# Patient Record
Sex: Male | Born: 1995 | Race: Black or African American | Hispanic: No | Marital: Single | Smoking: Current every day smoker
Health system: Southern US, Community
[De-identification: ages and names within clinical notes are randomized; demographics above are authoritative.]

## PROBLEM LIST (undated history)

## (undated) DIAGNOSIS — Z973 Presence of spectacles and contact lenses: Secondary | ICD-10-CM

## (undated) DIAGNOSIS — Z8709 Personal history of other diseases of the respiratory system: Secondary | ICD-10-CM

## (undated) DIAGNOSIS — L723 Sebaceous cyst: Secondary | ICD-10-CM

## (undated) HISTORY — PX: NO PAST SURGERIES: SHX2092

---
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
## 2013-08-06 ENCOUNTER — Encounter: Payer: Self-pay | Admitting: Internal Medicine

## 2013-08-06 ENCOUNTER — Ambulatory Visit (INDEPENDENT_AMBULATORY_CARE_PROVIDER_SITE_OTHER): Payer: BC Managed Care – PPO | Admitting: Internal Medicine

## 2013-08-06 VITALS — BP 110/64 | HR 62 | Temp 97.5°F | Ht 78.0 in | Wt 187.0 lb

## 2013-08-06 DIAGNOSIS — J45909 Unspecified asthma, uncomplicated: Secondary | ICD-10-CM | POA: Insufficient documentation

## 2013-08-06 MED ORDER — MOMETASONE FURO-FORMOTEROL FUM 100-5 MCG/ACT IN AERO: INHALATION_SPRAY | RESPIRATORY_TRACT | Status: DC

## 2013-08-06 NOTE — Progress Notes (Signed)
   Subjective:    Patient ID: Ray Page, male    DOB: Sep 06, 1995   MRN: [id number]  HPI  59 yobm started smoking 2011 with bad asthma as child on daily meds until 10th grade didn't need any meds continue to improve but in 12 th grade started more intense aerobics at that point  and noted first hour of ex was consistently tough due to doe then takes a break and does fine thereafter no real change with advair but albuterol.   08/06/2013 1st [facility] office visit/ Chae Oommen cc poor ex tol x one year, but only for the first game of b ball and does fine, ? If saba helps prior to first game ( never really answered that question.    No obvious day to day or daytime variabilty or assoc chronic cough or cp or chest tightness, subjective wheeze overt sinus or hb symptoms. No unusual exp hx or h/o childhood pna/ asthma or knowledge of premature birth.  Sleeping ok without nocturnal  or early am exacerbation  of respiratory  c/o's or need for noct saba. Also denies any obvious fluctuation of symptoms with weather or environmental changes or other aggravating or alleviating factors except as outlined above   Current Medications, Allergies, Complete Past Medical History, Past Surgical History, Family History, and Social History were reviewed in [employer] record.          Review of Systems  Constitutional: Negative for fever, chills, activity change, appetite change and unexpected weight change.  HENT: Negative for congestion, dental problem, postnasal drip, rhinorrhea, sneezing, sore throat, trouble swallowing and voice change.   Eyes: Negative for visual disturbance.  Respiratory: Positive for shortness of breath. Negative for cough and choking.   Cardiovascular: Negative for chest pain and leg swelling.  Gastrointestinal: Negative for nausea, vomiting and abdominal pain.  Genitourinary: Negative for difficulty urinating.  Musculoskeletal: Negative for arthralgias.   Skin: Negative for rash.  Psychiatric/Behavioral: Negative for behavioral problems and confusion.       Objective:   Physical Exam  amb bm nad  Wt Readings from Last 3 Encounters:  08/06/13 187 lb (84.823 kg) (86%*, Z = 1.09)   * Growth percentiles are based on CDC 2-20 Years data.     HEENT: nl dentition, turbinates, and orophanx. Nl external ear canals without cough reflex   NECK :  without JVD/Nodes/TM/ nl carotid upstrokes bilaterally   LUNGS: no acc muscle use, clear to A and P bilaterally without cough on insp or exp maneuvers   CV:  RRR  no s3 or murmur or increase in P2, no edema   ABD:  soft and nontender with nl excursion in the supine position. No bruits or organomegaly, bowel sounds nl  MS:  warm without deformities, calf tenderness, cyanosis or clubbing  SKIN: warm and dry without lesions    NEURO:  alert, approp, no deficits    CXR  08/06/2013 : Did not go as requested      Assessment & Plan:

## 2013-08-06 NOTE — Patient Instructions (Addendum)
Dulera 100 is 2 puffs taken each am   Only use your albuterol (proair) as a rescue medication to be used if you can't catch your breath by resting or doing a relaxed purse lip breathing pattern.  - The less you use it, the better it will work when you need it. - Ok to use up to every 4 hours if you must but call for immediate appointment if use goes up over your usual need - Don't leave home without it !!  (think of it like your spare tire for your car)   Please remember to go to the  x-ray department downstairs for your tests - we will call you with the results when they are available.    Please schedule a follow up office visit in 4 weeks, sooner if needed with pft's  Late add note did not go for cxr as requested

## 2013-08-07 NOTE — Assessment & Plan Note (Addendum)
Hx is very suggestive of pure EIA (the resolution after the first flare of the day is textbook pattern)   so rec trial of dulera 100 2pffs at least an hour before ex - if not improving needs cpst with spirometry before and after.  Other possibility includes anxiety related hyperventilation/ panic discorder but note never happens at rest or hs    Each maintenance medication was reviewed in detail including most importantly the difference between maintenance and as needed and under what circumstances the prns are to be used.  Please see instructions for details which were reviewed in writing and the patient given a copy.    The proper method of use, as well as anticipated side effects, of a metered-dose inhaler are discussed and demonstrated to the patient. Improved effectiveness after extensive coaching during this visit to a level of approximately  75%

## 2013-09-09 ENCOUNTER — Ambulatory Visit: Payer: BC Managed Care – PPO | Admitting: Internal Medicine

## 2013-09-17 ENCOUNTER — Ambulatory Visit (INDEPENDENT_AMBULATORY_CARE_PROVIDER_SITE_OTHER): Payer: BC Managed Care – PPO | Admitting: Family Medicine

## 2013-09-17 ENCOUNTER — Ambulatory Visit: Payer: BC Managed Care – PPO

## 2013-09-17 VITALS — BP 118/70 | HR 80 | Temp 98.2°F | Resp 16 | Ht 75.5 in | Wt 186.2 lb

## 2013-09-17 DIAGNOSIS — R0781 Pleurodynia: Secondary | ICD-10-CM

## 2013-09-17 DIAGNOSIS — R071 Chest pain on breathing: Secondary | ICD-10-CM

## 2013-09-17 DIAGNOSIS — T148XXA Other injury of unspecified body region, initial encounter: Secondary | ICD-10-CM

## 2013-09-17 DIAGNOSIS — R079 Chest pain, unspecified: Secondary | ICD-10-CM

## 2013-09-17 NOTE — Patient Instructions (Signed)
Back Exercises  Back exercises help treat and prevent back injuries. The goal is to increase your strength in your belly (abdominal) and back muscles. These exercises can also help with flexibility. Start these exercises when told by your doctor.  HOME CARE  Back exercises include:  Pelvic Tilt.  · Lie on your back with your knees bent. Tilt your pelvis until the lower part of your back is against the floor. Hold this position 5 to 10 sec. Repeat this exercise 5 to 10 times.  Knee to Chest.  · Pull 1 knee up against your chest and hold for 20 to 30 seconds. Repeat this with the other knee. This may be done with the other leg straight or bent, whichever feels better. Then, pull both knees up against your chest.  Sit-Ups or Curl-Ups.  · Bend your knees 90 degrees. Start with tilting your pelvis, and do a partial, slow sit-up. Only lift your upper half 30 to 45 degrees off the floor. Take at least 2 to 3 seonds for each sit-up. Do not do sit-ups with your knees out straight. If partial sit-ups are difficult, simply do the above but with only tightening your belly (abdominal) muscles and holding it as told.  Hip-Lift.  · Lie on your back with your knees flexed 90 degrees. Push down with your feet and shoulders as you raise your hips 2 inches off the floor. Hold for 10 seconds, repeat 5 to 10 times.  Back Arches.  · Lie on your stomach. Prop yourself up on bent elbows. Slowly press on your hands, causing an arch in your low back. Repeat 3 to 5 times.  Shoulder-Lifts.  · Lie face down with arms beside your body. Keep hips and belly pressed to floor as you slowly lift your head and shoulders off the floor.  Do not overdo your exercises. Be careful in the beginning. Exercises may cause you some mild back discomfort. If the pain lasts for more than 15 minutes, stop the exercises until you see your doctor. Improvement with exercise for back problems is slow.   Document Released: 09/16/2010 Document Revised: 11/06/2011  Document Reviewed: 06/15/2011  Exit[employer]® Patient Information ©2014 [employer].

## 2013-09-17 NOTE — Progress Notes (Signed)
Chief Complaint:  Chief Complaint  Patient presents with  . Chest Pain    right side in ribs. Has some pain with breathing in     HPI: Ray Page is a 18 y.o. male who is here for  Right sided rib pain, chest pain with deep breathing; all of this is made worse by taking deep breaths and some movement. He feels it worse when he stretches and takes a deep breath.  He had prior rib pain on the same side int he past due to sleeping on a battery. However this particular injury started this weekend after chopping wood.  He was cutting wood and chopping wood for his fireplace this weekend, all weekend long. Cut down 3-4 large trees.  No SOB, no wheezing. He has asthma especially when he plays basketball, he does not take anything at all for it. He was seen by Dr Sherene SiresWert and was [facility] and also has an albuterol inhaler, he states none of te inhalers or pills he has ever been on actually helps him. He tried the [facility] twice and it did not help so he stopped, he tried albuterol prior to exercise and that seem to not help so he stopped. He really only gets asthma sxs when he is exerting hiimself palyng basketball. He has never been hospitalized for asthma.    Past Medical History  Diagnosis Date  . Asthma    History reviewed. No pertinent past surgical history. History   Social History  . Marital Status: Single    Spouse Name: N/A    Number of Children: N/A  . Years of Education: N/A   Social History Main Topics  . Smoking status: Never Smoker   . Smokeless tobacco: Never Used     Comment: smokes approx 2 cigars per wk and smokes maryjuana daily   . Alcohol Use: No  . Drug Use: No     Comment: Smokes Maryjuana daily  . Sexual Activity: None   Other Topics Concern  . None   Social History Narrative  . None   Family History  Problem Relation Age of Onset  . Asthma Mother    No Known Allergies Prior to Admission medications   Medication Sig Start Date End  Date Taking? Authorizing Provider  mometasone-formoterol (DULERA) 100-5 MCG/ACT AERO Take 2 puffs first thing in am and then another 2 puffs about 12 hours later. 08/06/13   Nyoka CowdenMichael B Wert, MD     ROS: The patient denies fevers, chills, night sweats, unintentional weight loss, chest pain, palpitations, wheezing, dyspnea on exertion, nausea, vomiting, abdominal pain, dysuria, hematuria, melena, numbness, weakness, or tingling.   All other systems have been reviewed and were otherwise negative with the exception of those mentioned in the HPI and as above.    PHYSICAL EXAM: Filed Vitals:   09/17/13 1940  BP: 118/70  Pulse: 80  Temp: 98.2 F (36.8 C)  Resp: 16   Filed Vitals:   09/17/13 1940  Height: 6' 3.5" (1.918 m)  Weight: 186 lb 3.2 oz (84.46 kg)  Spo2           99% Body mass index is 22.96 kg/(m^2).  General: Alert, no acute distress HEENT:  Normocephalic, atraumatic, oropharynx patent. EOMI, PERRLA Cardiovascular:  Regular rate and rhythm, no rubs murmurs or gallops.  No Carotid bruits, radial pulse intact. No pedal edema. + Right sided rib pain with deep breaths and stretching, nontender on palpation Respiratory: Clear  to auscultation bilaterally.  No wheezes, rales, or rhonchi.  No cyanosis, no use of accessory musculature GI: No organomegaly, abdomen is soft and non-tender, positive bowel sounds.  No masses. Skin: No rashes. Neurologic: Facial musculature symmetric. Psychiatric: Patient is appropriate throughout our interaction. Lymphatic: No cervical lymphadenopathy Musculoskeletal: Gait intact.   LABS:    EKG/XRAY:   Primary read interpreted by Dr. Conley Rolls at [facility]. No fx or dislocation No acute cardiopulmonary process   ASSESSMENT/PLAN: Encounter Diagnoses  Name Primary?  . Pleuritic chest pain Yes  . Rib pain on right side   . Sprain and strain    Modify activities until improved, Take otc ibuprofen, bengay prn for rib pain Advise to take inhalers as  prescribed, advise that this is especially important if they only have firewood as a heat source. He states he cracks his windows pen so he does not inhale smoke or fumes.  F/u prn  Gross sideeffects, risk and benefits, and alternatives of medications d/w patient. Patient is aware that all medications have potential sideeffects and we are unable to predict every sideeffect or drug-drug interaction that may occur.  Hamilton Capri PHUONG, DO 09/17/2013 8:32 PM

## 2013-10-13 ENCOUNTER — Ambulatory Visit: Payer: BC Managed Care – PPO | Admitting: Internal Medicine

## 2013-10-15 ENCOUNTER — Encounter: Payer: Self-pay | Admitting: Internal Medicine

## 2014-02-17 ENCOUNTER — Encounter: Payer: Self-pay | Admitting: Adult Health

## 2014-02-17 ENCOUNTER — Ambulatory Visit (INDEPENDENT_AMBULATORY_CARE_PROVIDER_SITE_OTHER): Payer: BC Managed Care – PPO | Admitting: Adult Health

## 2014-02-17 VITALS — BP 124/68 | HR 83 | Temp 98.4°F | Ht 78.0 in | Wt 166.2 lb

## 2014-02-17 DIAGNOSIS — J4541 Moderate persistent asthma with (acute) exacerbation: Secondary | ICD-10-CM

## 2014-02-17 DIAGNOSIS — J45901 Unspecified asthma with (acute) exacerbation: Secondary | ICD-10-CM

## 2014-02-17 MED ORDER — PREDNISONE 10 MG PO TABS: ORAL_TABLET | ORAL | Status: DC

## 2014-02-17 MED ORDER — MOMETASONE FURO-FORMOTEROL FUM 100-5 MCG/ACT IN AERO
INHALATION_SPRAY | RESPIRATORY_TRACT | Status: DC
Start: 2014-02-17 — End: 2016-08-07

## 2014-02-17 MED ORDER — AZITHROMYCIN 250 MG PO TABS: ORAL_TABLET | ORAL | Status: AC

## 2014-02-17 NOTE — Patient Instructions (Addendum)
Zpack take as directed.  Mucinex DM Twice daily  As needed cough/congestion  Prednisone taper over next week.  Fluids and rest  Restart Dulera 2 puffs Twice daily  , rinse after use.  Please contact office for sooner follow up if symptoms do not improve or worsen or seek emergency care

## 2014-02-17 NOTE — Progress Notes (Signed)
   Subjective:    Patient ID: Ray Page, male    DOB: 02-04-1996   MRN: [id number]  HPI  18 yobm started smoking 2011 with bad asthma as child on daily meds until 10th grade didn't need any meds continue to improve but in 12 th grade started more intense aerobics at that point  and noted first hour of ex was consistently tough due to doe then takes a break and does fine thereafter no real change with advair but albuterol.   08/06/2013 1st [facility] office visit/ Wert cc poor ex tol x one year, but only for the first game of b ball and does fine, ? If saba helps prior to first game ( never really answered that question.   >>   02/17/2014 Acute OV  Complains of increased SOB, tightness, prod cough with yellow/beige mucus, wheezing, head congestion/sinus pressure with same colored/clear mucus, PND x3 days.  denies hemoptysis, f/c/s/n/v, leg sweling Not taking Dulera. Stopped when he was feeling good.  No recent travel or abx use.  Still smoking, advised on cessation     Current Medications, Allergies, Complete Past Medical History, Past Surgical History, Family History, and Social History were reviewed in [facility] electronic [facility] record.          Review of Systems  Constitutional: Negative for fever, chills, activity change, appetite change and unexpected weight change.  HENT: Negative for congestion, dental problem, postnasal drip, rhinorrhea, sneezing, sore throat, trouble swallowing and voice change.   Eyes: Negative for visual disturbance.  Respiratory:++cough  Cardiovascular: Negative for chest pain and leg swelling.  Gastrointestinal: Negative for nausea, vomiting and abdominal pain.  Genitourinary: Negative for difficulty urinating.  Musculoskeletal: Negative for arthralgias.  Skin: Negative for rash.  Psychiatric/Behavioral: Negative for behavioral problems and confusion.       Objective:   Physical Exam  amb bm nad     HEENT: nl dentition,  turbinates, and orophanx. Nl external ear canals without cough reflex   NECK :  without JVD/Nodes/TM/ nl carotid upstrokes bilaterally   LUNGS: no acc muscle use, clear to A and P bilaterally without cough on insp or exp maneuvers   CV:  RRR  no s3 or murmur or increase in P2, no edema   ABD:  soft and nontender with nl excursion in the supine position. No bruits or organomegaly, bowel sounds nl  MS:  warm without deformities, calf tenderness, cyanosis or clubbing  SKIN: warm and dry without lesions    NEURO:  alert, approp, no deficits        Assessment & Plan:

## 2014-02-24 NOTE — Assessment & Plan Note (Signed)
Flare with URI   Plan  Ray Page take as directed.  Mucinex DM Twice daily  As needed cough/congestion  Prednisone taper over next week.  Fluids and rest  Restart Dulera 2 puffs Twice daily  , rinse after use.  Please contact office for sooner follow up if symptoms do not improve or worsen or seek emergency care

## 2014-11-05 ENCOUNTER — Ambulatory Visit (INDEPENDENT_AMBULATORY_CARE_PROVIDER_SITE_OTHER): Payer: BC Managed Care – PPO

## 2014-11-05 ENCOUNTER — Ambulatory Visit (INDEPENDENT_AMBULATORY_CARE_PROVIDER_SITE_OTHER): Payer: BC Managed Care – PPO | Admitting: Family Medicine

## 2014-11-05 VITALS — BP 112/62 | HR 65 | Temp 98.2°F | Resp 18 | Ht 77.0 in | Wt 179.0 lb

## 2014-11-05 DIAGNOSIS — M79645 Pain in left finger(s): Secondary | ICD-10-CM

## 2014-11-05 DIAGNOSIS — M25561 Pain in right knee: Secondary | ICD-10-CM

## 2014-11-05 DIAGNOSIS — S86811A Strain of other muscle(s) and tendon(s) at lower leg level, right leg, initial encounter: Secondary | ICD-10-CM

## 2014-11-05 DIAGNOSIS — L02419 Cutaneous abscess of limb, unspecified: Secondary | ICD-10-CM

## 2014-11-05 DIAGNOSIS — S86911A Strain of unspecified muscle(s) and tendon(s) at lower leg level, right leg, initial encounter: Secondary | ICD-10-CM

## 2014-11-05 DIAGNOSIS — L03119 Cellulitis of unspecified part of limb: Secondary | ICD-10-CM | POA: Diagnosis not present

## 2014-11-05 MED ORDER — DOXYCYCLINE HYCLATE 100 MG PO CAPS: 100.0000 mg | ORAL_CAPSULE | Freq: Two times a day (BID) | ORAL | Status: DC

## 2014-11-05 MED ORDER — NAPROXEN 500 MG PO TABS: 500.0000 mg | ORAL_TABLET | Freq: Two times a day (BID) | ORAL | Status: DC

## 2014-11-05 NOTE — Progress Notes (Addendum)
Subjective: Patient care with a knee injury. He was playing golf and injured the knee when he landed. It is hurting him find the right knee along the hamstring in certain area. No effusion. He has been able to walk on it.  He also has a painful area at the base of his toe.  Object: tender right knee as noted.  No pain on abduction abduction. Mild flexion of the knee and stressing hurts him some. Painful nodule area at the base of his total.  Assessment: Right needs drained of hamstring insertion Small abscess of foot and right lateral calf  [facility] reading (PRIMARY) by  Dr. Alwyn RenHopper.  Normal knee  Plan: Take the doxycycline antibiotics one twice daily for the place on the toe and leg  Take naproxen one twice daily for pain and inflammation in the  Apply ice to the knee several times daily for the next few days  Stay off work through the weekend  Wear your knee splint  Return Monday for recheck and less during completely better cc Return Monday, off work till [employer]

## 2014-11-05 NOTE — Patient Instructions (Signed)
Take the doxycycline antibiotics one twice daily for the place on the toe and leg  Take naproxen one twice daily for pain and inflammation in the  Apply ice to the knee several times daily for the next few days  Stay off work through the weekend  Wear your knee splint  Return Monday for recheck and less during completely better

## 2014-11-08 LAB — WOUND CULTURE
Gram Stain: NONE SEEN
Gram Stain: NONE SEEN
Gram Stain: NONE SEEN

## 2016-04-06 IMAGING — CR DG FINGER RING 2+V*L*
3 series · 3 of 3 positions shown · non-contrast
Comparison: None.

CLINICAL DATA: Injured left fourth finger playing basketball, now
with pain and swelling.

EXAM:
LEFT RING FINGER 2+V

[PA]
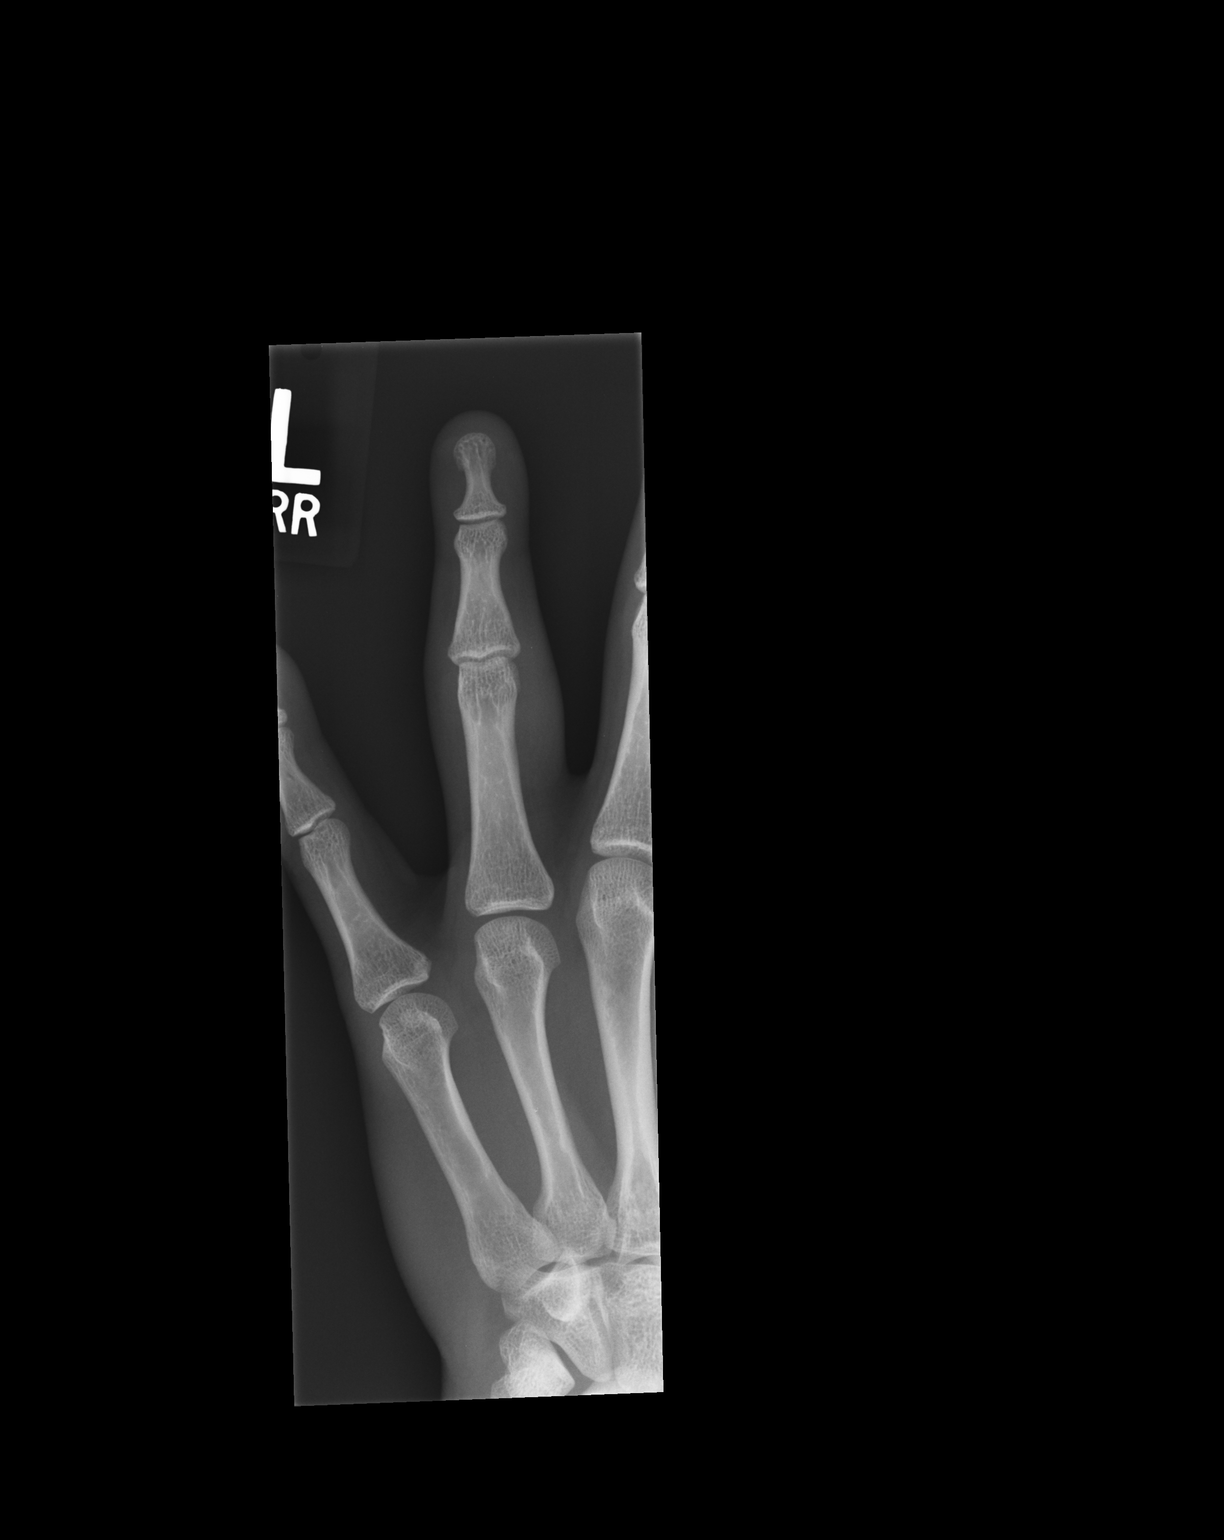

[pa obl]
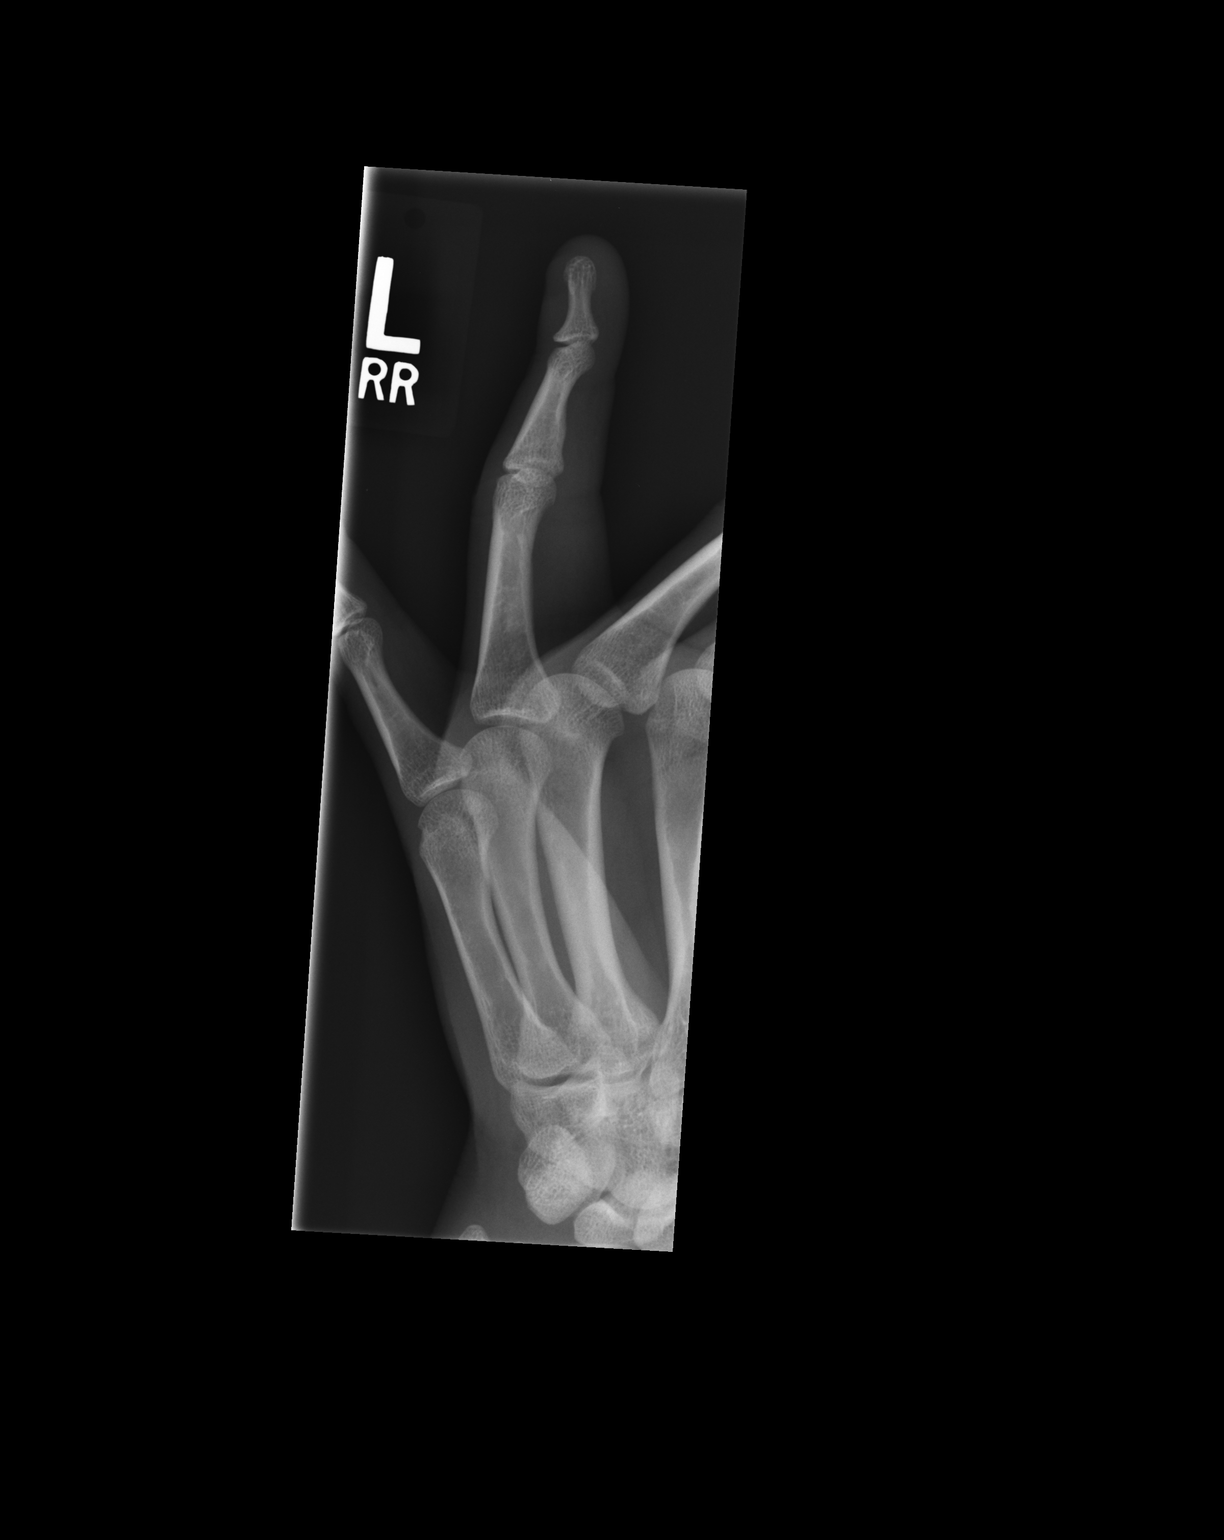

[lateral]
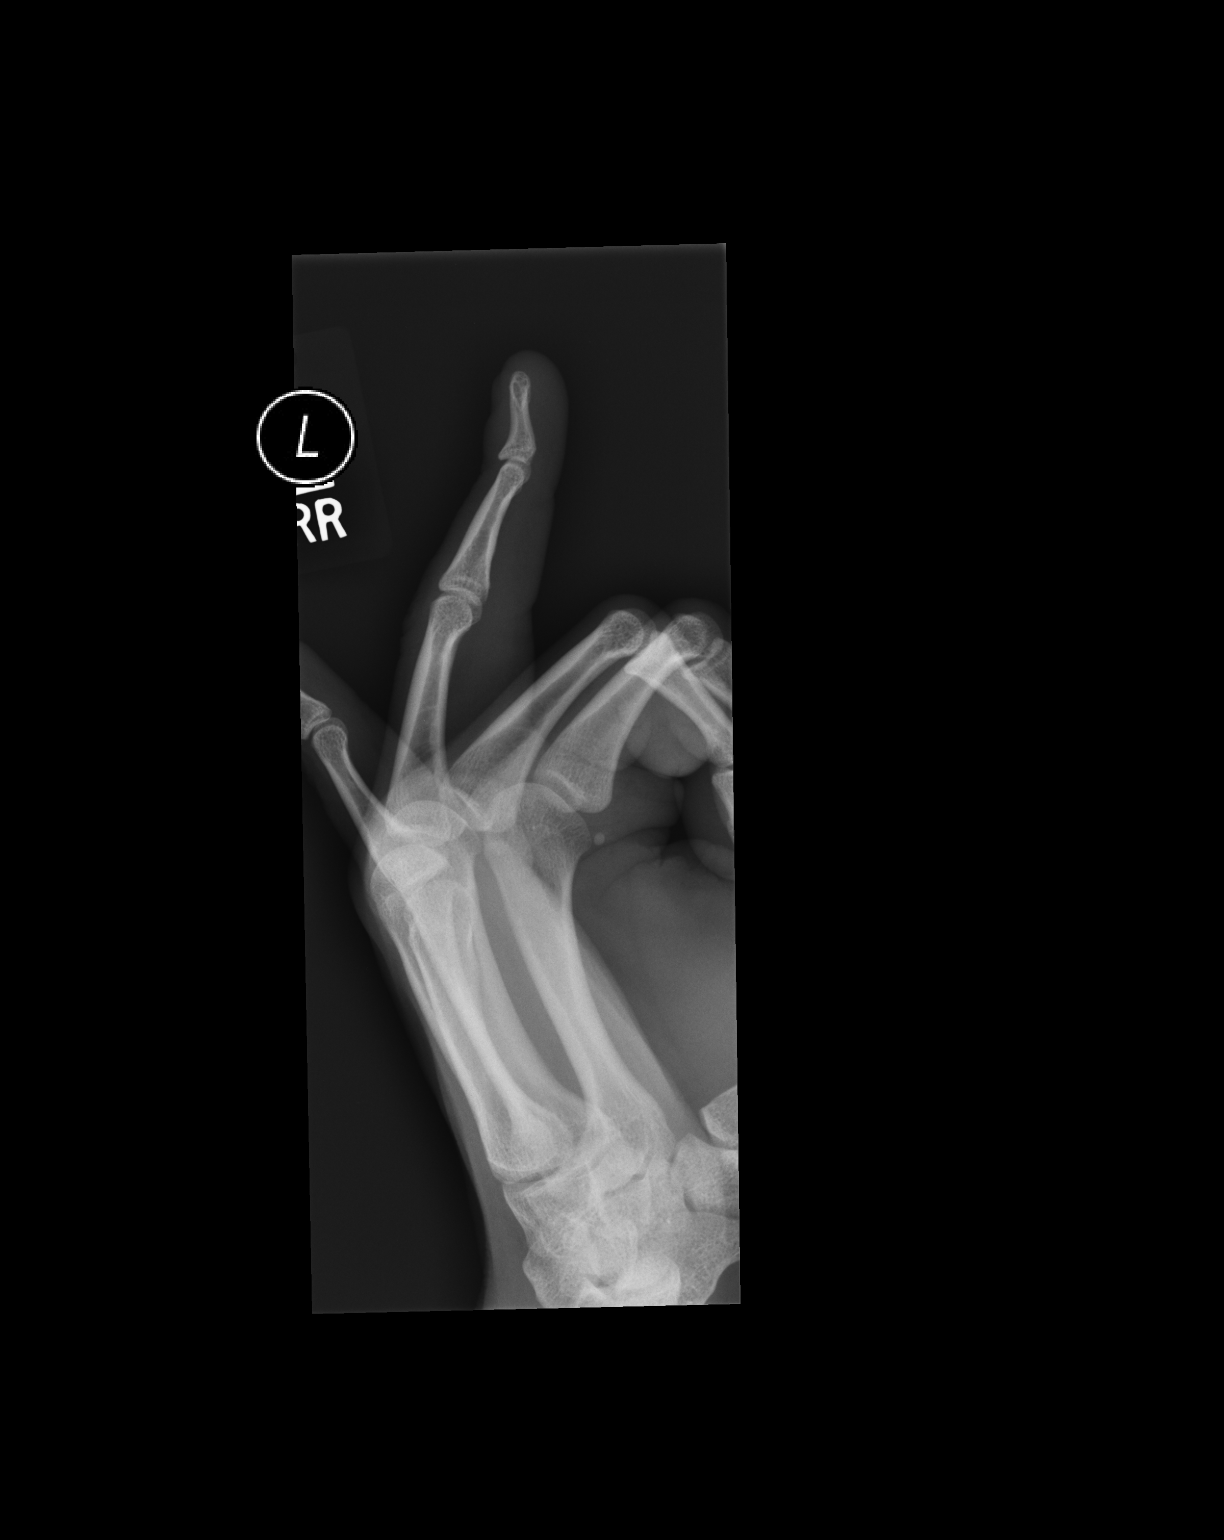

[3 of 3 positions shown; findings below may reference images not displayed]

FINDINGS: There mild diffuse soft tissue swelling about the PIP joint of the
fourth digit. This finding is without associated fracture,
dislocation or radiopaque foreign body. Joint spaces are preserved.
No erosions.
IMPRESSION: Soft tissue swelling about the PIP joint of the fourth digit without
associated fracture or dislocation.

## 2016-08-01 ENCOUNTER — Other Ambulatory Visit: Payer: Self-pay | Admitting: Urology

## 2016-08-07 ENCOUNTER — Encounter (HOSPITAL_BASED_OUTPATIENT_CLINIC_OR_DEPARTMENT_OTHER): Payer: Self-pay | Admitting: *Deleted

## 2016-08-07 NOTE — H&P (Signed)
Office Visit Report     07/25/2016   --------------------------------------------------------------------------------   Ray Page  MRN: [id number]  PRIMARY CARE:  Andi Devon, MD  DOB: 1995-11-04, 20 year old Male  REFERRING:  Andi Devon, MD  SSN: [id number]  PROVIDER:  Jerilee Field, M.D.    LOCATION:  [facility] [phone]   --------------------------------------------------------------------------------   CC/HPI: Pt with sebaceous cysts on scrotum for past 9 years. He's popped multiple ones, but there are about 6 remaining he would like removed. They are bothersome to him. There are no associated signs or symptoms and no aggravating factors.   He was evaluated with an u/s for hematuria and Proteinuria in 2013. His UA is clear today. No dysuria or gross hematuria. No voiding complaints.     ALLERGIES: No Allergies    MEDICATIONS: No Reported Medications     GU PSH: No GU PSH      PSH Notes: Pinky finger repair   NON-GU PSH: No Non-GU PSH    GU PMH: Hematuria, Unspec, Hematuria - 2014 Isolated proteinuria, Isolated proteinuria - 2014 Urinary Tract Inf, Unspec site, Urinary tract infection - 2014      PMH Notes:  1898-08-28 00:00:00 - Note: Normal Routine History And Physical Adolescent (12 - 30)   NON-GU PMH: Asthma, Asthma - 2014 Encounter for routine child health examination without abnormal findings, Well adolescent visit - 2014 Personal history of other diseases of the nervous system and sense organs, History of sleep apnea - 2014 Sebaceous cyst, Sebaceous cyst - 2014    FAMILY HISTORY: Asthma - Mother Diabetes - Grandmother Hematuria - Grandmother Hypertension - Grandmother Kidney Failure - Runs in Family   SOCIAL HISTORY: Marital Status: Single Current Smoking Status: Patient smokes occasionally. Has smoked since 06/28/2010. Smokes 1 pack per day.  Drinks 1 drink per day.  Does not drink caffeine. Patient's occupation  Optometrist.     Notes: Tobacco use, Marital History - Single, Alcohol Use   REVIEW OF SYSTEMS:    GU Review Male:   Patient reports leakage of urine. Patient denies frequent urination, hard to postpone urination, burning/ pain with urination, get up at night to urinate, stream starts and stops, trouble starting your stream, have to strain to urinate , erection problems, and penile pain.  Gastrointestinal (Upper):   Patient denies nausea, vomiting, and indigestion/ heartburn.  Gastrointestinal (Lower):   Patient denies diarrhea and constipation.  Constitutional:   Patient denies fever, night sweats, weight loss, and fatigue.  Skin:   Patient denies skin rash/ lesion and itching.  Eyes:   Patient denies blurred vision and double vision.  Ears/ Nose/ Throat:   Patient denies sore throat and sinus problems.  Hematologic/Lymphatic:   Patient denies swollen glands and easy bruising.  Cardiovascular:   Patient denies leg swelling and chest pains.  Respiratory:   Patient denies cough and shortness of breath.  Endocrine:   Patient denies excessive thirst.  Musculoskeletal:   Patient reports joint pain. Patient denies back pain.  Neurological:   Patient denies headaches and dizziness.  Psychologic:   Patient denies depression and anxiety.   VITAL SIGNS:      07/25/2016 09:41 AM  Weight 167 lb / 75.75 kg  Height 77 in / 195.58 cm  BP 113/73 mmHg  Pulse 60 /min  Temperature 97.7 F / 36 C  BMI 19.8 kg/m   GU PHYSICAL EXAMINATION:    Anus and Perineum: No hemorrhoids. No anal stenosis. No rectal fissure,  no anal fissure. No edema, no dimple, no perineal tenderness, no anal tenderness. Inner right lateral medial buttock pt has a 10 mm scarred cyst he had previously self expressed. No drainage. Skin colored induration.   Scrotum: Several sebaceous cysts - about 5 ranging from 3 mm - 10 mm with an addition 10 mm in the right inguinal area. No edema. No warts.   Epididymides: Right: no  spermatocele, no masses, no cysts, no tenderness, no induration, no enlargement. Left: no spermatocele, no masses, no cysts, no tenderness, no induration, no enlargement.  Testes: No tenderness, no swelling, no enlargement left testes. No tenderness, no swelling, no enlargement right testes. Normal location left testes. Normal location right testes. No mass, no cyst, no varicocele, no hydrocele left testes. No mass, no cyst, no varicocele, no hydrocele right testes.  Urethral Meatus: Normal size. No lesion, no wart, no discharge, no polyp. Normal location.  Penis: Circumcised, no warts, no cracks. No dorsal Peyronie's plaques, no left corporal Peyronie's plaques, no right corporal Peyronie's plaques, no scarring, no warts. No balanitis, no meatal stenosis.   MULTI-SYSTEM PHYSICAL EXAMINATION:    Constitutional: Well-nourished. No physical deformities. Normally developed. Good grooming.  Neck: Neck symmetrical, not swollen. Normal tracheal position.  Respiratory: No labored breathing, no use of accessory muscles.   Cardiovascular: Normal temperature, normal extremity pulses, no swelling, no varicosities.  Lymphatic: No enlargement of neck, axillae, groin.  Skin: No paleness, no jaundice, no cyanosis. No lesion, no ulcer, no rash.  Neurologic / Psychiatric: Oriented to time, oriented to place, oriented to person. No depression, no anxiety, no agitation.  Gastrointestinal: No mass, no tenderness, no rigidity, non obese abdomen.  Eyes: Normal conjunctivae. Normal eyelids.  Ears, Nose, Mouth, and Throat: Left ear no scars, no lesions, no masses. Right ear no scars, no lesions, no masses. Nose no scars, no lesions, no masses. Normal hearing. Normal lips.  Musculoskeletal: Normal gait and station of head and neck.     PAST DATA REVIEWED:  Source Of History:  Patient   PROCEDURES:          Urinalysis - 81003 Dipstick Dipstick Cont'd  Color: Yellow Bilirubin: Neg  Appearance: Clear Ketones: Neg   Specific Gravity: 1.025 Blood: Neg  pH: 6.5 Protein: Neg  Glucose: Neg Urobilinogen: 0.2    Nitrites: Neg    Leukocyte Esterase: Neg    Notes:      ASSESSMENT:      ICD-10 Details  1 NON-GU:   Sebaceous cyst - L72.3 Stable   PLAN:           Schedule Return Visit: Next Available Appointment - Office Visit, Schedule Surgery          Document Letter(s):  Created for Patient: Clinical Summary         Notes:   I discussed with the patient the nature risks benefits and alternatives to scrotal cyst excision. Some of these are small incision and scarring maybe more visible and unsightly than the cysts. He understands and would like to proceed. His UA was clear today - no protein or MH.   cc: Dr. Kathryne SharperShelton     E & M CODE: I spent at least 30 minutes face to face with the patient, more than 50% of that time was spent on counseling and/or coordinating care.     * Signed by Jerilee FieldMatthew Santiago Page, M.D. on 07/25/16 at 5:33 PM (EST)*     The information contained in this medical record document is considered private  and confidential patient information. This information can only be used for the medical diagnosis and/or medical services that are being provided by the patient's selected caregivers. This information can only be distributed outside of the patient's care if the patient agrees and signs waivers of authorization for this information to be sent to an outside source or route.

## 2016-08-07 NOTE — Progress Notes (Signed)
NPO AFTER MN.  ARRIVE AT 0945.  NEEDS HG.  

## 2016-08-08 ENCOUNTER — Ambulatory Visit (HOSPITAL_BASED_OUTPATIENT_CLINIC_OR_DEPARTMENT_OTHER): Payer: BC Managed Care – PPO | Admitting: Anesthesiology

## 2016-08-08 ENCOUNTER — Encounter (HOSPITAL_BASED_OUTPATIENT_CLINIC_OR_DEPARTMENT_OTHER)
Admission: RE | Disposition: A | Discharge status: Home / Self Care | Payer: Self-pay | Source: Ambulatory Visit | Attending: Urology

## 2016-08-08 ENCOUNTER — Encounter (HOSPITAL_BASED_OUTPATIENT_CLINIC_OR_DEPARTMENT_OTHER): Payer: Self-pay | Admitting: *Deleted

## 2016-08-08 ENCOUNTER — Ambulatory Visit (HOSPITAL_BASED_OUTPATIENT_CLINIC_OR_DEPARTMENT_OTHER)
Admission: RE | Admit: 2016-08-08 | Discharge: 2016-08-08 | Disposition: A | Discharge status: Home / Self Care | Payer: BC Managed Care – PPO | Source: Ambulatory Visit | Attending: Urology | Admitting: Urology

## 2016-08-08 DIAGNOSIS — L723 Sebaceous cyst: Secondary | ICD-10-CM | POA: Diagnosis not present

## 2016-08-08 DIAGNOSIS — F17210 Nicotine dependence, cigarettes, uncomplicated: Secondary | ICD-10-CM | POA: Diagnosis not present

## 2016-08-08 HISTORY — DX: Presence of spectacles and contact lenses: Z97.3

## 2016-08-08 HISTORY — PX: CYST EXCISION: SHX5701

## 2016-08-08 HISTORY — DX: Personal history of other diseases of the respiratory system: Z87.09

## 2016-08-08 HISTORY — DX: Sebaceous cyst: L72.3

## 2016-08-08 LAB — POCT HEMOGLOBIN-HEMACUE: HEMOGLOBIN: 15.5 g/dL (ref 13.0–17.0)

## 2016-08-08 SURGERY — CYST REMOVAL
Anesthesia: General | Site: Scrotum

## 2016-08-08 MED ORDER — PROMETHAZINE HCL 25 MG/ML IJ SOLN
6.2500 mg | INTRAMUSCULAR | Status: DC | PRN
  Filled 2016-08-08: qty 1

## 2016-08-08 MED ORDER — KETOROLAC TROMETHAMINE 30 MG/ML IJ SOLN
INTRAMUSCULAR | Status: DC | PRN
  Administered 2016-08-08: 30 mg via INTRAVENOUS

## 2016-08-08 MED ORDER — LACTATED RINGERS IV SOLN
INTRAVENOUS | Status: DC
  Administered 2016-08-08 (×2): via INTRAVENOUS
  Filled 2016-08-08: qty 1000

## 2016-08-08 MED ORDER — OXYCODONE HCL 5 MG PO TABS
5.0000 mg | ORAL_TABLET | Freq: Once | ORAL | Status: DC | PRN
  Filled 2016-08-08: qty 1

## 2016-08-08 MED ORDER — PROPOFOL 10 MG/ML IV BOLUS
INTRAVENOUS | Status: DC | PRN
  Administered 2016-08-08: 100 mg via INTRAVENOUS
  Administered 2016-08-08: 300 mg via INTRAVENOUS

## 2016-08-08 MED ORDER — OXYCODONE HCL 5 MG/5ML PO SOLN
5.0000 mg | Freq: Once | ORAL | Status: DC | PRN
  Filled 2016-08-08: qty 5

## 2016-08-08 MED ORDER — MIDAZOLAM HCL 2 MG/2ML IJ SOLN
INTRAMUSCULAR | Status: AC
  Filled 2016-08-08: qty 2

## 2016-08-08 MED ORDER — BUPIVACAINE HCL (PF) 0.5 % IJ SOLN
INTRAMUSCULAR | Status: AC
  Filled 2016-08-08: qty 30

## 2016-08-08 MED ORDER — CEFAZOLIN SODIUM-DEXTROSE 2-4 GM/100ML-% IV SOLN
2.0000 g | INTRAVENOUS | Status: AC
  Administered 2016-08-08: 2 g via INTRAVENOUS
  Filled 2016-08-08: qty 100

## 2016-08-08 MED ORDER — CEFAZOLIN SODIUM-DEXTROSE 2-4 GM/100ML-% IV SOLN
INTRAVENOUS | Status: AC
  Filled 2016-08-08: qty 100

## 2016-08-08 MED ORDER — FENTANYL CITRATE (PF) 100 MCG/2ML IJ SOLN
INTRAMUSCULAR | Status: AC
  Filled 2016-08-08: qty 2

## 2016-08-08 MED ORDER — FENTANYL CITRATE (PF) 100 MCG/2ML IJ SOLN
INTRAMUSCULAR | Status: DC | PRN
  Administered 2016-08-08 (×2): 25 ug via INTRAVENOUS
  Administered 2016-08-08: 50 ug via INTRAVENOUS

## 2016-08-08 MED ORDER — MIDAZOLAM HCL 5 MG/5ML IJ SOLN
INTRAMUSCULAR | Status: DC | PRN
  Administered 2016-08-08: 2 mg via INTRAVENOUS

## 2016-08-08 MED ORDER — LIDOCAINE-EPINEPHRINE (PF) 1 %-1:200000 IJ SOLN
INTRAMUSCULAR | Status: AC
  Filled 2016-08-08: qty 30

## 2016-08-08 MED ORDER — DEXAMETHASONE SODIUM PHOSPHATE 10 MG/ML IJ SOLN
INTRAMUSCULAR | Status: AC
  Filled 2016-08-08: qty 1

## 2016-08-08 MED ORDER — PROPOFOL 10 MG/ML IV BOLUS
INTRAVENOUS | Status: AC
  Filled 2016-08-08: qty 40

## 2016-08-08 MED ORDER — LIDOCAINE 2% (20 MG/ML) 5 ML SYRINGE
INTRAMUSCULAR | Status: AC
  Filled 2016-08-08: qty 5

## 2016-08-08 MED ORDER — EPINEPHRINE PF 1 MG/ML IJ SOLN
INTRAMUSCULAR | Status: AC
  Filled 2016-08-08: qty 1

## 2016-08-08 MED ORDER — HYDROMORPHONE HCL 1 MG/ML IJ SOLN
0.2500 mg | INTRAMUSCULAR | Status: DC | PRN
  Filled 2016-08-08: qty 0.5

## 2016-08-08 MED ORDER — PHENYLEPHRINE 40 MCG/ML (10ML) SYRINGE FOR IV PUSH (FOR BLOOD PRESSURE SUPPORT)
PREFILLED_SYRINGE | INTRAVENOUS | Status: DC | PRN
  Administered 2016-08-08: 80 ug via INTRAVENOUS

## 2016-08-08 MED ORDER — MEPERIDINE HCL 25 MG/ML IJ SOLN
6.2500 mg | INTRAMUSCULAR | Status: DC | PRN
  Filled 2016-08-08: qty 1

## 2016-08-08 MED ORDER — LIDOCAINE 2% (20 MG/ML) 5 ML SYRINGE
INTRAMUSCULAR | Status: DC | PRN
  Administered 2016-08-08: 100 mg via INTRAVENOUS

## 2016-08-08 MED ORDER — BUPIVACAINE HCL 0.5 % IJ SOLN
INTRAMUSCULAR | Status: DC | PRN
  Administered 2016-08-08: 8 mL

## 2016-08-08 MED ORDER — ONDANSETRON HCL 4 MG/2ML IJ SOLN
INTRAMUSCULAR | Status: DC | PRN
  Administered 2016-08-08: 4 mg via INTRAVENOUS

## 2016-08-08 MED ORDER — LACTATED RINGERS IV SOLN
INTRAVENOUS | Status: DC
  Filled 2016-08-08: qty 1000

## 2016-08-08 MED ORDER — DEXAMETHASONE SODIUM PHOSPHATE 4 MG/ML IJ SOLN
INTRAMUSCULAR | Status: DC | PRN
Start: 2016-08-08 — End: 2016-08-08
  Administered 2016-08-08: 10 mg via INTRAVENOUS

## 2016-08-08 MED ORDER — ONDANSETRON HCL 4 MG/2ML IJ SOLN
INTRAMUSCULAR | Status: AC
  Filled 2016-08-08: qty 2

## 2016-08-08 MED ORDER — KETOROLAC TROMETHAMINE 30 MG/ML IJ SOLN
INTRAMUSCULAR | Status: AC
  Filled 2016-08-08: qty 1

## 2016-08-08 SURGICAL SUPPLY — 38 items
BLADE SURG 15 STRL LF DISP TIS (BLADE) ×1 IMPLANT
BLADE SURG 15 STRL SS (BLADE) ×3
BNDG GAUZE ELAST 4 BULKY (GAUZE/BANDAGES/DRESSINGS) ×3 IMPLANT
COVER BACK TABLE 60X90IN (DRAPES) ×3 IMPLANT
COVER MAYO STAND STRL (DRAPES) ×3 IMPLANT
DRAIN PENROSE 18X1/4 LTX STRL (WOUND CARE) IMPLANT
DRAPE LAPAROTOMY 100X72 PEDS (DRAPES) ×3 IMPLANT
DRSG TEGADERM 4X4.75 (GAUZE/BANDAGES/DRESSINGS) ×2 IMPLANT
DRSG TELFA 3X8 NADH (GAUZE/BANDAGES/DRESSINGS) ×3 IMPLANT
ELECT REM PT RETURN 9FT ADLT (ELECTROSURGICAL) ×3
ELECTRODE REM PT RTRN 9FT ADLT (ELECTROSURGICAL) ×1 IMPLANT
GAUZE SPONGE 4X4 16PLY XRAY LF (GAUZE/BANDAGES/DRESSINGS) ×3 IMPLANT
GLOVE BIOGEL M STRL SZ7.5 (GLOVE) ×3 IMPLANT
GOWN STRL REUS W/ TWL XL LVL3 (GOWN DISPOSABLE) ×1 IMPLANT
GOWN STRL REUS W/TWL XL LVL3 (GOWN DISPOSABLE) ×3
KIT ROOM TURNOVER WOR (KITS) ×3 IMPLANT
MANIFOLD NEPTUNE II (INSTRUMENTS) IMPLANT
NEEDLE HYPO 22GX1.5 SAFETY (NEEDLE) ×3 IMPLANT
NS IRRIG 500ML POUR BTL (IV SOLUTION) ×3 IMPLANT
PACK BASIN DAY SURGERY FS (CUSTOM PROCEDURE TRAY) ×3 IMPLANT
PAD DRESSING TELFA 3X8 NADH (GAUZE/BANDAGES/DRESSINGS) IMPLANT
PENCIL BUTTON HOLSTER BLD 10FT (ELECTRODE) ×3 IMPLANT
SPONGE LAP 4X18 X RAY DECT (DISPOSABLE) ×3 IMPLANT
SUPPORT SCROTAL LG STRP (MISCELLANEOUS) IMPLANT
SUPPORT SCROTAL MED ADLT STRP (MISCELLANEOUS) IMPLANT
SUPPORTER ATHLETIC LG (MISCELLANEOUS)
SUPPORTER ATHLETIC MED (MISCELLANEOUS)
SUT CHROMIC 3 0 SH 27 (SUTURE) ×4 IMPLANT
SUT PDS AB 4-0 RB1 27 (SUTURE) IMPLANT
SUT VIC AB 2-0 SH 27 (SUTURE) ×3
SUT VIC AB 2-0 SH 27XBRD (SUTURE) ×1 IMPLANT
SYR BULB IRRIGATION 50ML (SYRINGE) ×3 IMPLANT
SYR CONTROL 10ML LL (SYRINGE) IMPLANT
TRAY DSU PREP LF (CUSTOM PROCEDURE TRAY) ×3 IMPLANT
TUBE CONNECTING 12'X1/4 (SUCTIONS) ×1
TUBE CONNECTING 12X1/4 (SUCTIONS) ×2 IMPLANT
WATER STERILE IRR 500ML POUR (IV SOLUTION) IMPLANT
YANKAUER SUCT BULB TIP NO VENT (SUCTIONS) ×3 IMPLANT

## 2016-08-08 NOTE — Transfer of Care (Signed)
Immediate Anesthesia Transfer of Care Note  Patient: Kyung BaccaBobby L Freimark  Procedure(s) Performed: Procedure(s): CYST REMOVAL, SCROTAL SEBACEOUS (N/A)  Patient Location: PACU  Anesthesia Type:General  Level of Consciousness: awake, alert  and oriented  Airway & Oxygen Therapy: Patient Spontanous Breathing and Patient connected to nasal cannula oxygen  Post-op Assessment: Report given to RN  Post vital signs: Reviewed and stable  Last Vitals: 119/57, 68, 13,100%, 97.4 Vitals:   08/08/16 0949  BP: 121/70  Pulse: 79  Resp: 16  Temp: 36.3 C    Last Pain:  Vitals:   08/08/16 0949  TempSrc: Oral      Patients Stated Pain Goal: 8 (08/08/16 1014)  Complications: No apparent anesthesia complications

## 2016-08-08 NOTE — Interval H&P Note (Signed)
History and Physical Interval Note:  08/08/2016 10:51 AM  Ray BaccaBobby L Page  has presented today for surgery, with the diagnosis of SEBACEOUS CYSTS, SCROTAL  The various methods of treatment have been discussed with the patient and family. After consideration of risks, benefits and other options for treatment, the patient has consented to  Procedure(s): CYST REMOVAL, SCROTAL SEBACEOUS (N/A) as a surgical intervention .  The patient's history has been reviewed, patient examined, no change in status, stable for surgery.  I have reviewed the patient's chart and labs. We marked 7 cysts to remove from scrotum- range from 3 mm - ~10 mm.  Questions were answered to the patient's satisfaction. Other areas appear to have been manually drained and seem empty, discussed they could recur. Discussed risk of scar, poor cosmesis, recurrence risk among others.     [name]

## 2016-08-08 NOTE — Op Note (Addendum)
Preoperative diagnosis: Scrotal sebaceous cysts Postoperative diagnosis: Same  Procedure: Excision of scrotal sebaceous cysts, largest 10 mm  Surgeon: Mena GoesEskridge  Anesthesia: Gen. and local  Indications for procedure 20 year old with bothersome scrotal sebaceous cysts. He elected excision.  Findings: Cystic lesion beneath the skin with white waxy contents. A 10 mm area right scrotal/inguinal, two 5 mm areas right scrotum, two 5 mm areas connected left scrotum, two 5 mm areas connected inferior left scrotum (these were palpated with the patient in the preop area but not marked. They were excised).   Description of procedure: After consent was obtained patient brought to the operating room. The previously marked cysts were grasped with pickups and using the Bovie cautery on cut current a skin incision was made around the cyst. A 10 mm area right scrotal/inguinal, two 5 mm areas right scrotum, I was able to dissect inferior and dissect the cyst off the underlying dartos fascia. The 2 areas on the left scrotum contained 2 cysts which ended up connecting into one elliptical skin incision. This was similar for the left inferior cyst as well. All of the incisions were irrigated and the skin injected with lidocaine around the incisions. The incisions were closed with interrupted 3-0 chromic suture. Telfa and tape were applied. The patient was awakened taken to the recovery room in stable condition.  Complications: None  Blood loss: Minimal  Specimens to pathology: I sent a few representative cysts to pathology  Drains: None  Disposition: stable to PACU

## 2016-08-08 NOTE — Anesthesia Preprocedure Evaluation (Addendum)
Anesthesia Evaluation  Patient identified by MRN, date of birth, ID band Patient awake    Reviewed: Allergy & Precautions, NPO status , Patient's Chart, lab work & pertinent test results  Airway Mallampati: I  TM Distance: >3 FB Neck ROM: Full    Dental  (+) Teeth Intact, Dental Advisory Given   Pulmonary asthma , Current Smoker,    breath sounds clear to auscultation       Cardiovascular negative cardio ROS   Rhythm:Regular Rate:Normal     Neuro/Psych negative neurological ROS  negative psych ROS   GI/Hepatic negative GI ROS, Neg liver ROS,   Endo/Other  negative endocrine ROS  Renal/GU negative Renal ROS  negative genitourinary   Musculoskeletal negative musculoskeletal ROS (+)   Abdominal   Peds negative pediatric ROS (+)  Hematology negative hematology ROS (+)   Anesthesia Other Findings   Reproductive/Obstetrics negative OB ROS                            Anesthesia Physical Anesthesia Plan  ASA: I  Anesthesia Plan: General   Post-op Pain Management:    Induction: Intravenous  Airway Management Planned: LMA  Additional Equipment:   Intra-op Plan:   Post-operative Plan: Extubation in OR  Informed Consent: I have reviewed the patients History and Physical, chart, labs and discussed the procedure including the risks, benefits and alternatives for the proposed anesthesia with the patient or authorized representative who has indicated his/her understanding and acceptance.   Dental advisory given  Plan Discussed with: CRNA  Anesthesia Plan Comments:         Anesthesia Quick Evaluation

## 2016-08-08 NOTE — Brief Op Note (Signed)
08/08/2016  12:04 PM  PATIENT:  Ray Page  20 y.o. male  PRE-OPERATIVE DIAGNOSIS:  SEBACEOUS CYSTS, SCROTAL  POST-OPERATIVE DIAGNOSIS:  SEBACEOUS CYSTS, SCROTAL  PROCEDURE:  Procedure(s): CYST REMOVAL, SCROTAL SEBACEOUS (N/A)  SURGEON:  Surgeon(s) and Role:    * Jerilee FieldMatthew Deziah Renwick, MD - Primary  PHYSICIAN ASSISTANT:   ASSISTANTS: none   ANESTHESIA:   general  EBL:  Total I/O In: 1000 [I.V.:1000] Out: 5 [Blood:5]  BLOOD ADMINISTERED:none  DRAINS: none   LOCAL MEDICATIONS USED:  LIDOCAINE   SPECIMEN:  Source of Specimen:  scrotal sebaceous cysts  DISPOSITION OF SPECIMEN:  PATHOLOGY  COUNTS:  YES  TOURNIQUET:  * No tourniquets in log *  DICTATION: .Dragon Dictation  PLAN OF CARE: Discharge to home after PACU  PATIENT DISPOSITION:  PACU - hemodynamically stable.   Delay start of Pharmacological VTE agent (>24hrs) due to surgical blood loss or risk of bleeding: not applicable

## 2016-08-08 NOTE — Anesthesia Postprocedure Evaluation (Signed)
Anesthesia Post Note  Patient: Ray Page  Procedure(s) Performed: Procedure(s) (LRB): CYST REMOVAL, SCROTAL SEBACEOUS (N/A)  Patient location during evaluation: PACU Anesthesia Type: General Level of consciousness: awake and alert Pain management: pain level controlled Vital Signs Assessment: post-procedure vital signs reviewed and stable Respiratory status: spontaneous breathing, nonlabored ventilation, respiratory function stable and patient connected to nasal cannula oxygen Cardiovascular status: blood pressure returned to baseline and stable Postop Assessment: no signs of nausea or vomiting Anesthetic complications: no    Last Vitals:  Vitals:   08/08/16 1245 08/08/16 1307  BP: 115/76 133/77  Pulse: 62 (!) 59  Resp: 15 14  Temp:  36.7 C    Last Pain:  Vitals:   08/08/16 1307  TempSrc:   PainSc: 0-No pain                 Shelton SilvasKevin D Hollis

## 2016-08-08 NOTE — Discharge Instructions (Signed)
Post Anesthesia Home Care Instructions  Activity: Get plenty of rest for the remainder of the day. A responsible adult should stay with you for 24 hours following the procedure.  For the next 24 hours, DO NOT: -Drive a car -Advertising copywriterperate machinery -Drink alcoholic beverages -Take any medication unless instructed by your physician -Make any legal decisions or sign important papers.  Meals: Start with liquid foods such as gelatin or soup. Progress to regular foods as tolerated. Avoid greasy, spicy, heavy foods. If nausea and/or vomiting occur, drink only clear liquids until the nausea and/or vomiting subsides. Call your physician if vomiting continues.  Special Instructions/Symptoms: Your throat may feel dry or sore from the anesthesia or the breathing tube placed in your throat during surgery. If this causes discomfort, gargle with warm salt water. The discomfort should disappear within 24 hours.  If you had a scopolamine patch placed behind your ear for the management of post- operative nausea and/or vomiting:  1. The medication in the patch is effective for 72 hours, after which it should be removed.  Wrap patch in a tissue and discard in the trash. Wash hands thoroughly with soap and water. 2. You may remove the patch earlier than 72 hours if you experience unpleasant side effects which may include dry mouth, dizziness or visual disturbances. 3. Avoid touching the patch. Wash your hands with soap and water after contact with the patch.    HOME CARE INSTRUCTIONS FOR SCROTAL PROCEDURES  Wound Care & Hygiene: You may apply an ice bag to the scrotum for the first 24 hours.  This may help decrease swelling and soreness.  You may have a dressing held in place by an athletic supporter.  You may remove the dressing in 24 hours and shower in 48 hours.  Continue to use the athletic supporter or tight briefs for at least a week. Activity: Rest today - not necessarily flat bed rest.  Just take it easy.   You should not do strenuous activities until your follow-up visit with your doctor.  You may resume light activity in 48 hours.  Return to Work:  Your doctor will advise you of this depending on the type of work you do  Diet: Drink liquids or eat a light diet this evening.  You may resume a regular diet tomorrow.  General Expectations: You may have a small amount of bleeding.  The scrotum may be swollen or bruised for about a week.  Call your Doctor if these occur:  -persistent or heavy bleeding  -temperature of 101 degrees or more  -severe pain, not relieved by your pain medication  [facility] to [facility]:  Call to set up and appointment.  Patient Signature:  __________________________________________________  Nurse's Signature:  __________________________________________________ Incision Care, Adult An incision is a surgical cut that is made through your skin. Most incisions are closed after surgery. Your incision may be closed with stitches (sutures), staples, skin glue, or adhesive strips. You may need to return to your health care provider to have sutures or staples removed. This may occur several days to several weeks after your surgery. The incision needs to be cared for properly to prevent infection. How to care for your incision Incision care   Follow instructions from your health care provider about how to take care of your incision. Make sure you:  Wash your hands with soap and water before you change the bandage (dressing). If soap and water are not available, use hand sanitizer.  Change your dressing as  told by your health care provider.  Leave sutures, skin glue, or adhesive strips in place. These skin closures may need to stay in place for 2 weeks or longer. If adhesive strip edges start to loosen and curl up, you may trim the loose edges. Do not remove adhesive strips completely unless your health care provider tells you to do that.  Check your incision area  every day for signs of infection. Check for:  More redness, swelling, or pain.  More fluid or blood.  Warmth.  Pus or a bad smell.  Ask your health care provider how to clean the incision. This may include:  Using mild soap and water.  Using a clean towel to pat the incision dry after cleaning it.  Applying a cream or ointment. Do this only as told by your health care provider.  Covering the incision with a clean dressing.  Ask your health care provider when you can leave the incision uncovered.  Do not take baths, swim, or use a hot tub until your health care provider approves. Ask your health care provider if you can take showers. You may only be allowed to take sponge baths for bathing. Medicines  If you were prescribed an antibiotic medicine, cream, or ointment, take or apply the antibiotic as told by your health care provider. Do not stop taking or applying the antibiotic even if your condition improves.  Take over-the-counter and prescription medicines only as told by your health care provider. General instructions  Limit movement around your incision to improve healing.  Avoid straining, lifting, or exercise for the first month, or for as long as told by your health care provider.  Follow instructions from your health care provider about returning to your normal activities.  Ask your health care provider what activities are safe.  Protect your incision from the sun when you are outside for the first 6 months, or for as long as told by your health care provider. Apply sunscreen around the scar or cover it up.  Keep all follow-up visits as told by your health care provider. This is important. Contact a health care provider if:  Your have more redness, swelling, or pain around the incision.  You have more fluid or blood coming from the incision.  Your incision feels warm to the touch.  You have pus or a bad smell coming from the incision.  You have a fever or  shaking chills.  You are nauseous or you vomit.  You are dizzy.  Your sutures or staples come undone. Get help right away if:  You have a red streak coming from your incision.  Your incision bleeds through the dressing and the bleeding does not stop with gentle pressure.  The edges of your incision open up and separate.  You have severe pain.  You have a rash.  You are confused.  You faint.  You have trouble breathing and a fast heartbeat. This information is not intended to replace advice given to you by your health care provider. Make sure you discuss any questions you have with your health care provider. Document Released: 03/03/2005 Document Revised: 04/21/2016 Document Reviewed: 03/01/2016 Elsevier Interactive Patient Education  2017 [employer]

## 2016-08-08 NOTE — Anesthesia Procedure Notes (Signed)
Procedure Name: LMA Insertion Date/Time: 08/08/2016 11:00 AM Performed by: Maris BergerENENNY, Ray Page Pre-anesthesia Checklist: Patient identified, Emergency Drugs available, Suction available and Patient being monitored Patient Re-evaluated:Patient Re-evaluated prior to inductionOxygen Delivery Method: Circle system utilized Preoxygenation: Pre-oxygenation with 100% oxygen Intubation Type: IV induction Ventilation: Mask ventilation without difficulty LMA: LMA inserted LMA Size: 5.0 Number of attempts: 1 Airway Equipment and Method: Bite block Placement Confirmation: positive ETCO2 Tube secured with: Tape Dental Injury: Teeth and Oropharynx as per pre-operative assessment

## 2016-08-09 ENCOUNTER — Encounter (HOSPITAL_BASED_OUTPATIENT_CLINIC_OR_DEPARTMENT_OTHER): Payer: Self-pay | Admitting: Urology

## 2016-11-24 ENCOUNTER — Emergency Department (HOSPITAL_COMMUNITY)
Admission: EM | Admit: 2016-11-24 | Discharge: 2016-11-24 | Disposition: A | Discharge status: Home / Self Care | Payer: BC Managed Care – PPO | Attending: Emergency Medicine | Admitting: Emergency Medicine

## 2016-11-24 ENCOUNTER — Encounter (HOSPITAL_COMMUNITY): Payer: Self-pay | Admitting: *Deleted

## 2016-11-24 ENCOUNTER — Emergency Department (HOSPITAL_COMMUNITY): Payer: BC Managed Care – PPO

## 2016-11-24 DIAGNOSIS — F17290 Nicotine dependence, other tobacco product, uncomplicated: Secondary | ICD-10-CM | POA: Diagnosis not present

## 2016-11-24 DIAGNOSIS — Y939 Activity, unspecified: Secondary | ICD-10-CM | POA: Diagnosis not present

## 2016-11-24 DIAGNOSIS — M25561 Pain in right knee: Secondary | ICD-10-CM | POA: Insufficient documentation

## 2016-11-24 DIAGNOSIS — Y92481 Parking lot as the place of occurrence of the external cause: Secondary | ICD-10-CM | POA: Diagnosis not present

## 2016-11-24 DIAGNOSIS — Y999 Unspecified external cause status: Secondary | ICD-10-CM | POA: Diagnosis not present
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
## 2016-11-24 NOTE — Discharge Instructions (Signed)
Please use rest, ice, compression, elevation to right knee. Take ibuProfen or naproxen as needed for pain relief. Use knee sleeve as needed. Please follow up with orthopedics in about 2 weeks if symptoms persist.  Get help right away if: Your knee feels warm to the touch. You cannot move your knee. You have severe pain in your knee. You have chest pain. You have trouble breathing.

## 2016-11-24 NOTE — ED Notes (Signed)
Pt verbalized understanding discharge instructions and denies any further needs or questions at this time. VS stable, ambulatory and steady gait.   

## 2018-12-13 ENCOUNTER — Encounter (HOSPITAL_COMMUNITY): Payer: Self-pay

## 2018-12-13 ENCOUNTER — Ambulatory Visit (HOSPITAL_COMMUNITY)
Admission: EM | Admit: 2018-12-13 | Discharge: 2018-12-13 | Disposition: A | Discharge status: Home / Self Care | Payer: Self-pay | Attending: Family Medicine | Admitting: Family Medicine

## 2018-12-13 DIAGNOSIS — R369 Urethral discharge, unspecified: Secondary | ICD-10-CM | POA: Insufficient documentation

## 2018-12-13 DIAGNOSIS — Z7251 High risk heterosexual behavior: Secondary | ICD-10-CM | POA: Insufficient documentation

## 2018-12-13 DIAGNOSIS — L700 Acne vulgaris: Secondary | ICD-10-CM | POA: Insufficient documentation

## 2018-12-13 MED ORDER — CEFTRIAXONE SODIUM 250 MG IJ SOLR
INTRAMUSCULAR | Status: AC
  Filled 2018-12-13: qty 250

## 2018-12-13 MED ORDER — AZITHROMYCIN 250 MG PO TABS
ORAL_TABLET | ORAL | Status: AC
  Filled 2018-12-13: qty 4

## 2018-12-13 MED ORDER — CEFTRIAXONE SODIUM 250 MG IJ SOLR
250.0000 mg | Freq: Once | INTRAMUSCULAR | Status: AC
  Administered 2018-12-13: 13:00:00 250 mg via INTRAMUSCULAR

## 2018-12-13 MED ORDER — AZITHROMYCIN 250 MG PO TABS
1000.0000 mg | ORAL_TABLET | Freq: Once | ORAL | Status: AC
  Administered 2018-12-13: 13:00:00 1000 mg via ORAL

## 2018-12-13 MED ORDER — DOXYCYCLINE HYCLATE 100 MG PO CAPS: 100.0000 mg | ORAL_CAPSULE | Freq: Two times a day (BID) | ORAL | 0 refills | Status: AC

## 2018-12-13 MED ORDER — LIDOCAINE HCL (PF) 1 % IJ SOLN
INTRAMUSCULAR | Status: AC
  Filled 2018-12-13: qty 2

## 2018-12-13 NOTE — ED Triage Notes (Signed)
Pt states needs a STD check, denies discharge. C/o "bumps" to buttocks and groin area
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
## 2018-12-13 NOTE — ED Provider Notes (Signed)
[facility]    CSN: [id number] Arrival date & time: 12/13/18  1210     History   Chief Complaint Chief Complaint  Patient presents with  . Exposure to STD    HPI Ray Page is a 23 y.o. male.   HPI  Patient is here for evaluation of possible sexually transmitted disease.  He states that he has unprotected sex "a lot".  Is not interested in hearing my discussion about sexually transmitted diseases and safety.  He states that he currently has a penile discharge.  He has not been notified by any of his partners of a specific infection.  He would like evaluation and treatment.  He also has a rash that is been present for years.  He shows me a number of comedones and nodules on his back and buttocks.  There is few on his face and chest.  It looks like a cystic acne.  He does not have a primary care doctor or dermatologist.  He would like to know how to get rid of these "bumps". No fever chills.  No nausea vomiting.  No urinary burning or abdominal pain.  Past Medical History:  Diagnosis Date  . Personal history of asthma    dx childhood---  pt states  no issues since 2011  . Scrotal sebaceous cyst   . Wears contact lenses     Patient Active Problem List   Diagnosis Date Noted  . Asthma, chronic 08/06/2013    Past Surgical History:  Procedure Laterality Date  . CYST EXCISION N/A 08/08/2016   Procedure: CYST REMOVAL, SCROTAL SEBACEOUS;  Surgeon: Jerilee FieldMatthew Eskridge, MD;  [facility];  Service: Urology;  Laterality: N/A;  . NO PAST SURGERIES         Home Medications    Prior to Admission medications   Medication Sig Start Date End Date Taking? Authorizing Provider  doxycycline (VIBRAMYCIN) 100 MG capsule Take 1 capsule (100 mg total) by mouth 2 (two) times daily. 12/13/18   Eustace MooreNelson, Labrandon Knoch Sue, MD    Family History Family History  Problem Relation Age of Onset  . Asthma Mother     Social History Social History   Tobacco Use  .  Smoking status: Current Every Day Smoker    Years: 6.00    Types: Cigars  . Smokeless tobacco: Never Used  . Tobacco comment: smokes 2-3 cigar daily  Substance Use Topics  . Alcohol use: No  . Drug use: No     Allergies   Other   Review of Systems Review of Systems  Constitutional: Negative for chills and fever.  HENT: Negative for ear pain and sore throat.   Eyes: Negative for pain and visual disturbance.  Respiratory: Negative for cough and shortness of breath.   Cardiovascular: Negative for chest pain and palpitations.  Gastrointestinal: Negative for abdominal pain and vomiting.  Genitourinary: Positive for discharge. Negative for dysuria and hematuria.  Musculoskeletal: Negative for arthralgias and back pain.  Skin: Positive for rash. Negative for color change.  Neurological: Negative for seizures and syncope.  All other systems reviewed and are negative.    Physical Exam Triage Vital Signs ED Triage Vitals [12/13/18 1221]  Enc Vitals Group     BP 108/68     Pulse Rate 95     Resp 18     Temp 98.6 F (37 C)     Temp Source Oral     SpO2 99 %     Weight  Height      Head Circumference      Peak Flow      Pain Score 0     Pain Loc      Pain Edu?      Excl. in GC?    No data found.  Updated Vital Signs BP 108/68 (BP Location: Right Arm)   Pulse 95   Temp 98.6 F (37 C) (Oral)   Resp 18   SpO2 99%   Physical Exam Constitutional:      General: He is not in acute distress.    Appearance: He is well-developed.  HENT:     Head: Normocephalic and atraumatic.  Eyes:     Conjunctiva/sclera: Conjunctivae normal.     Pupils: Pupils are equal, round, and reactive to light.  Neck:     Musculoskeletal: Normal range of motion.  Cardiovascular:     Rate and Rhythm: Normal rate.  Pulmonary:     Effort: Pulmonary effort is normal. No respiratory distress.  Abdominal:     General: There is no distension.     Palpations: Abdomen is soft.   Genitourinary:    Penis: Normal.      Scrotum/Testes: Normal.     Comments: Circumcised penis.  No discharge identified.  No rash on genitals. Musculoskeletal: Normal range of motion.  Skin:    General: Skin is warm and dry.     Findings: Rash present.     Comments: Back anterior chest and buttocks are covered with small pustules, nodules, open and closed comedones  Neurological:     Mental Status: He is alert.  Psychiatric:        Mood and Affect: Mood normal.        Behavior: Behavior normal.      UC Treatments / Results  Labs (all labs ordered are listed, but only abnormal results are displayed) Labs Reviewed  RPR  HIV ANTIBODY (ROUTINE TESTING W REFLEX)  URINE CYTOLOGY ANCILLARY ONLY    EKG None  Radiology No results found.  Procedures Procedures (including critical care time)  Medications Ordered in UC Medications  cefTRIAXone (ROCEPHIN) injection 250 mg (250 mg Intramuscular Given 12/13/18 1309)  azithromycin (ZITHROMAX) tablet 1,000 mg (1,000 mg Oral Given 12/13/18 1309)    Initial Impression / Assessment and Plan / UC Course  I have reviewed the triage vital signs and the nursing notes.  Pertinent labs & imaging results that were available during my care of the patient were reviewed by me and considered in my medical decision making (see chart for details).     Impressed upon patient the importance of prevention of STDs. Final Clinical Impressions(s) / UC Diagnoses   Final diagnoses:  Penile discharge  Cystic acne  High risk sexual behavior, unspecified type     Discharge Instructions     Because you have had unprotected sexual relations you are being tested for chlamydia, gonorrhea, trichomonas, syphilis and HIV. You were treated with rocephin and azithromycin to treat gonorrhea and chlamydia We will call if any other tests are positive.  For the skin infections I am prescribing doxycyline 100 mg 2 x a day    ED Prescriptions    Medication  Sig Dispense Auth. Provider   doxycycline (VIBRAMYCIN) 100 MG capsule Take 1 capsule (100 mg total) by mouth 2 (two) times daily. 60 capsule Eustace Moore, MD     Controlled Substance Prescriptions [place] Controlled Substance Registry consulted? No   Eustace Moore, MD 12/13/18 1330

## 2018-12-14 LAB — RPR: RPR Ser Ql: NONREACTIVE

## 2018-12-14 LAB — HIV ANTIBODY (ROUTINE TESTING W REFLEX): HIV Screen 4th Generation wRfx: NONREACTIVE

## 2018-12-16 LAB — URINE CYTOLOGY ANCILLARY ONLY
Chlamydia: NEGATIVE
Neisseria Gonorrhea: NEGATIVE

## 2019-11-24 ENCOUNTER — Ambulatory Visit (INDEPENDENT_AMBULATORY_CARE_PROVIDER_SITE_OTHER): Payer: BC Managed Care – PPO | Admitting: Dermatology

## 2019-11-24 ENCOUNTER — Other Ambulatory Visit: Payer: Self-pay

## 2019-11-24 ENCOUNTER — Encounter: Payer: Self-pay | Admitting: Dermatology

## 2019-11-24 DIAGNOSIS — L729 Follicular cyst of the skin and subcutaneous tissue, unspecified: Secondary | ICD-10-CM | POA: Diagnosis not present

## 2019-11-24 DIAGNOSIS — L722 Steatocystoma multiplex: Secondary | ICD-10-CM

## 2019-11-24 DIAGNOSIS — L720 Epidermal cyst: Secondary | ICD-10-CM | POA: Diagnosis not present

## 2019-11-24 NOTE — Patient Instructions (Addendum)
This is the first visit for a knowledgeable young man named Ray Page.  He has had multiple cysts for many years.  A urologist at [facility] apparently did a 4-hour surgery on multiple lesions of the groin several years ago, but did not really discuss things with Mr. Ray Page.  Examination showed 2-3 larger subcutaneous nodules on the lower and inner buttocks which he says are frequently painful when sitting.  There are multiple more milia-like cysts on the scrotum.  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  I detailed the benign but admittedly bothersome nature of the cysts with Mr. Ray Page.  Should he be motivated to have multiple lesions removed, this would certainly be done more comfortably with general anesthesia.  We will try to arrange a consultation with either Dr. Westley Foots, a plastic surgeon in [place] or Dr. Wenda Low, a general surgeon here in [place].  I have encouraged Mr. Ray Page to check to make sure these are providers under his health plan.  Follow-up with me can be on a as needed basis.

## 2019-11-25 NOTE — Progress Notes (Signed)
   New Patient   Subjective  Ray Page is a 24 y.o. male who presents for the following: Cyst (Patient here today for cyst on back, waist, buttocks, scrotum, thighs, chest, and shoulders x years.  Patient had 12 removed in 2019, patient states that he has two that's opened at this time.  Patient states the cyst are painful and he wants treatment.).  cysts Location: All over but worst on groin Duration: Years Quality: Increased number Associated Signs/Symptoms: Those antibiotics frequently sore Modifying Factors: Previous for our surgery by [facility] Severity:  Timing: Context:    The following portions of the chart were reviewed this encounter and updated as appropriate:     Objective  Well appearing patient in no apparent distress; mood and affect are within normal limits.  A full examination was performed including scalp, head, eyes, ears, nose, lips, neck, chest, axillae, abdomen, back, buttocks, bilateral upper extremities, bilateral lower extremities, hands, feet, fingers, toes, fingernails, and toenails. All findings within normal limits unless otherwise noted below. This is the first visit for a knowledgeable young man named Chiropodist.  He has had multiple cysts for many years.  A urologist at [facility] apparently did a 4-hour surgery on multiple lesions of the groin several years ago, but did not really discuss things with Mr. Pasillas.  Examination showed 2-3 larger subcutaneous nodules on the lower and inner buttocks which he says are frequently painful when sitting.  There are multiple more milia-like cysts on the scrotum.  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  I detailed the benign but admittedly bothersome nature of the cysts with Mr. Fassnacht.  Should he be motivated to have multiple lesions removed, this would certainly be done more comfortably with general anesthesia.  We will try to arrange a consultation with either Dr. Westley Foots, a plastic surgeon in [place] or Dr. Wenda Low, a general surgeon here in [place].  I have encouraged Mr. Kenley to check to make sure these are providers under his health plan.  Follow-up with me can be on a as needed basis.  Assessment & Plan  Epidermal cyst

## 2020-01-05 ENCOUNTER — Other Ambulatory Visit: Payer: Self-pay | Admitting: Surgery

## 2020-01-05 DIAGNOSIS — R590 Localized enlarged lymph nodes: Secondary | ICD-10-CM

## 2020-01-19 ENCOUNTER — Ambulatory Visit
Admission: RE | Admit: 2020-01-19 | Discharge: 2020-01-19 | Disposition: A | Discharge status: Home / Self Care | Payer: BC Managed Care – PPO | Source: Ambulatory Visit | Attending: Surgery | Admitting: Surgery

## 2020-01-19 DIAGNOSIS — R590 Localized enlarged lymph nodes: Secondary | ICD-10-CM

## 2020-01-19 MED ORDER — IOPAMIDOL (ISOVUE-300) INJECTION 61%
100.0000 mL | Freq: Once | INTRAVENOUS | Status: AC | PRN
  Administered 2020-01-19: 100 mL via INTRAVENOUS

## 2021-06-20 IMAGING — CT CT ABD-PELV W/ CM
2 of 4 series · 12 of 46 positions shown, 14 images · IV contrast (iopamidol)
Comparison: Abdomen ultrasound [DATE].

CLINICAL DATA: 24-year-old male with inguinal lymphadenopathy.
Sebaceous cyst.

EXAM:
CT ABDOMEN AND PELVIS WITH CONTRAST
TECHNIQUE: Multidetector CT imaging of the abdomen and pelvis was performed
using the standard protocol following bolus administration of
intravenous contrast.
CONTRAST:  100mL 2YIC1Y-C99 IOPAMIDOL (2YIC1Y-C99) INJECTION 61%

[Series 2: abd pelvis 5.00 br40 s3 axial · axial · 0.53mm/px · z∈[+1332,+1702]mm · 9 of 88 slices shown, 11 images]
[im 7/88  soft-tissue]
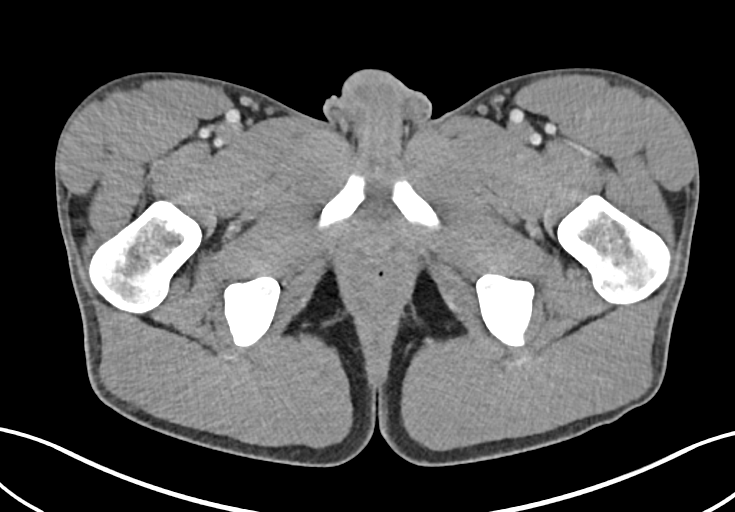
[im 7/88  bone]
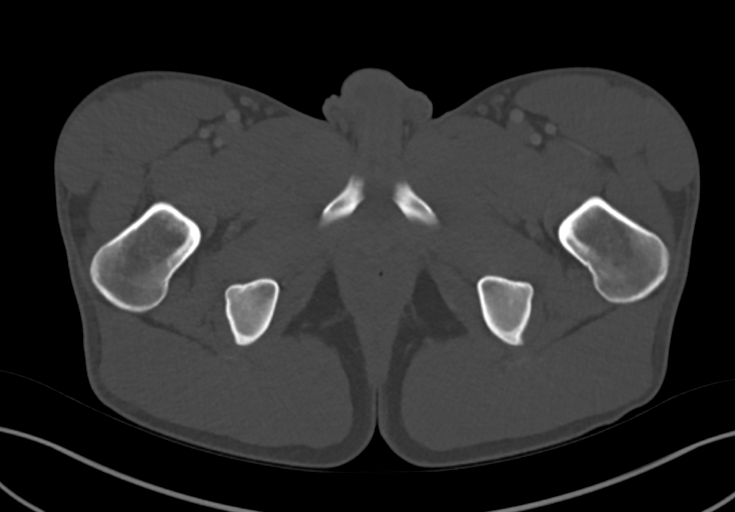
[im 18/88  soft-tissue]
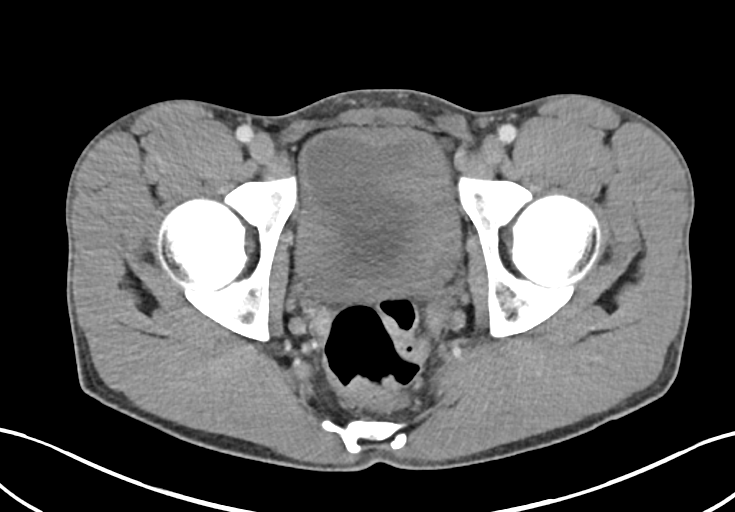
[im 25/88  soft-tissue]
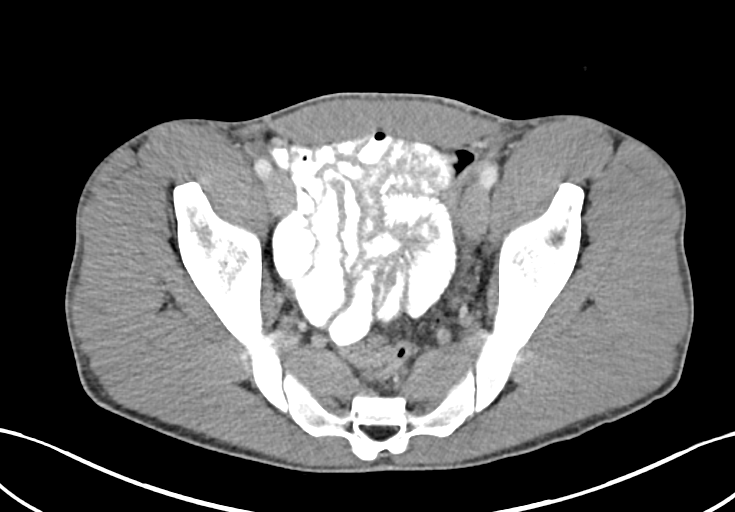
[im 35/88  soft-tissue]
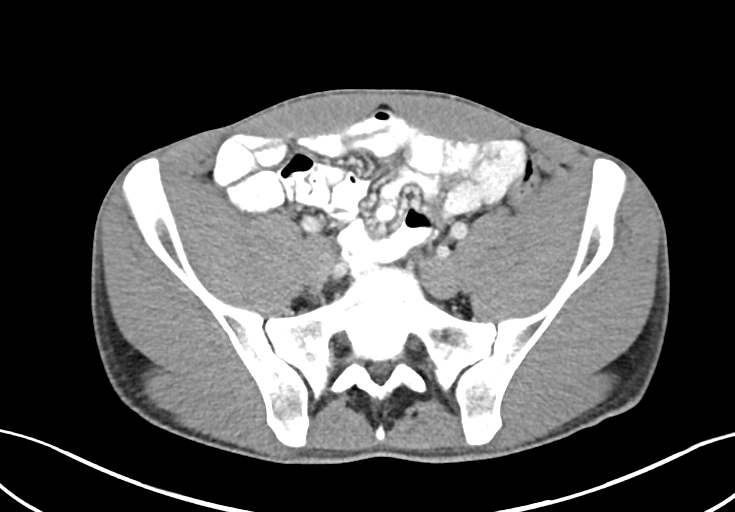
[im 46/88  soft-tissue]
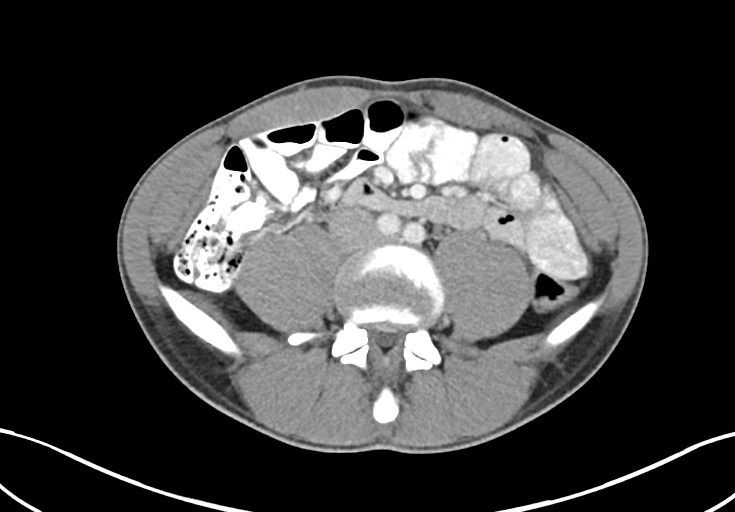
[im 53/88  soft-tissue]
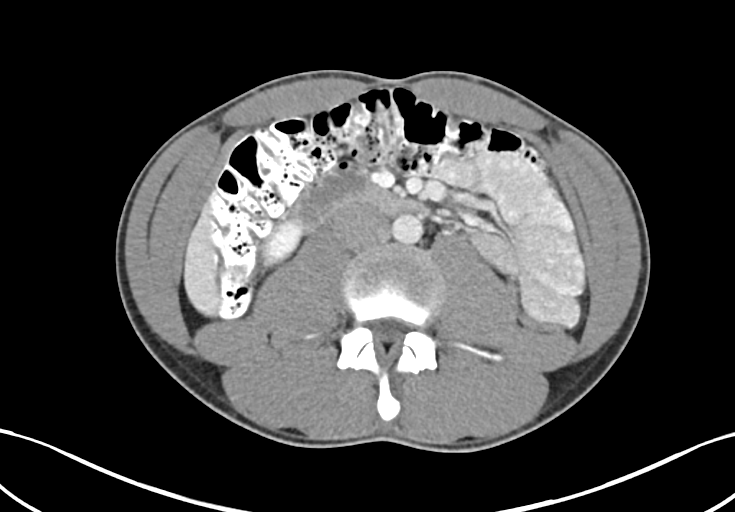
[im 63/88  soft-tissue]
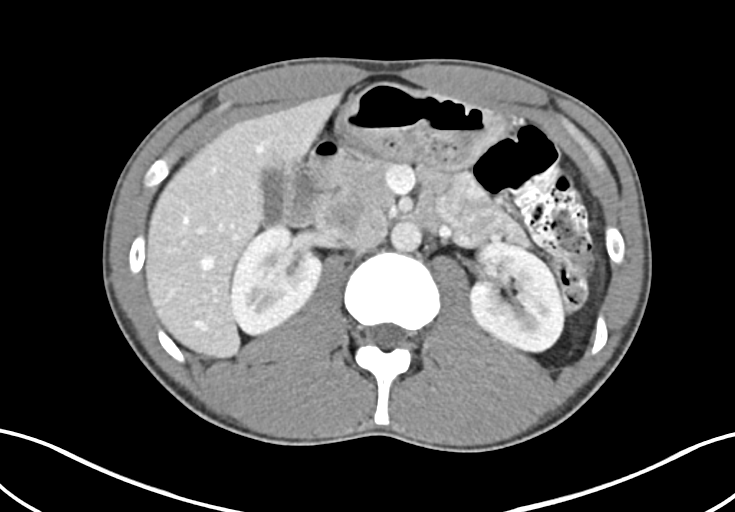
[im 74/88  soft-tissue]
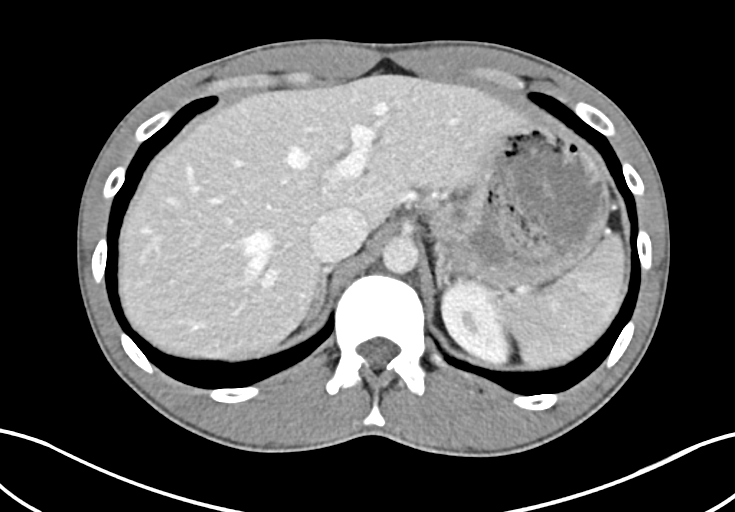
[im 81/88  soft-tissue]
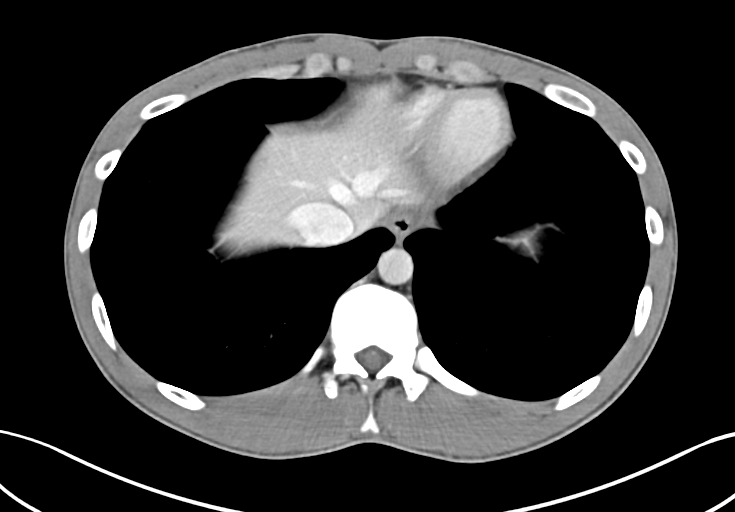
[im 81/88  bone]
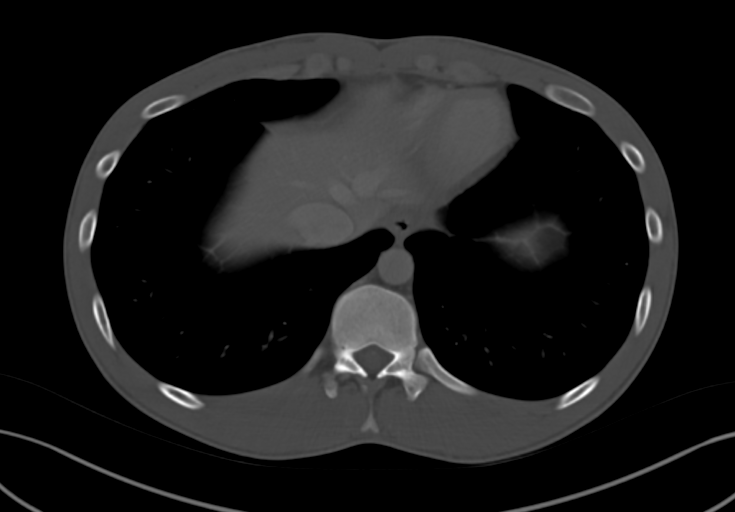

[Series 6: abd pelvis 2.00 br40 s3 cor · coronal · 0.76mm/px · 3 of 136 slices shown]
[im 61/136  soft-tissue]
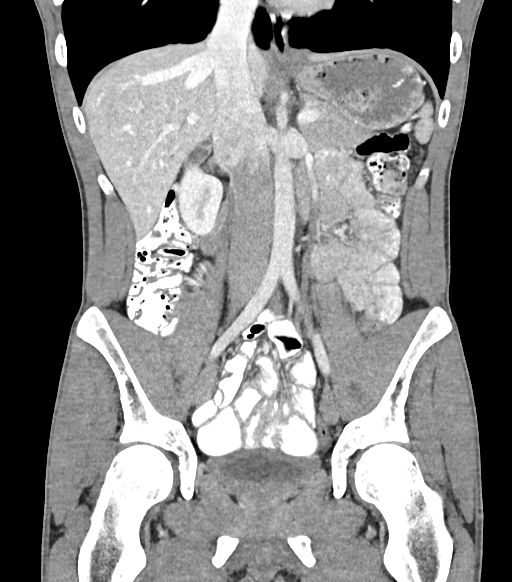
[im 76/136  soft-tissue]
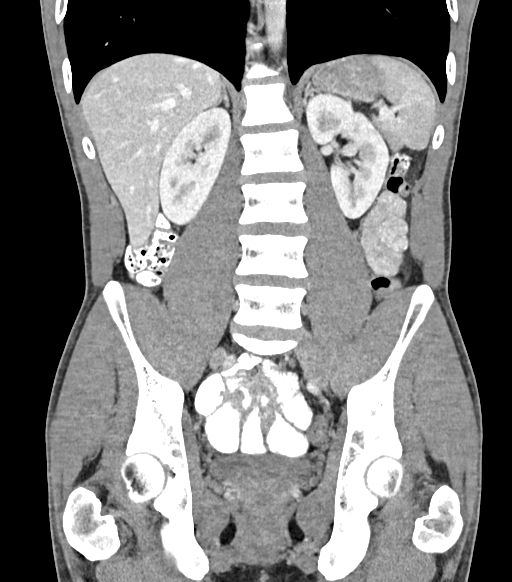
[im 91/136  soft-tissue]
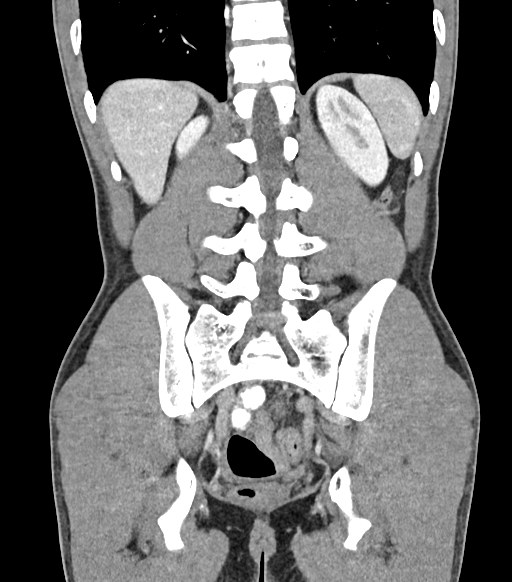

[12 of 46 positions shown; findings below may reference images not displayed]

FINDINGS: Lower chest: Negative.

Hepatobiliary: In the anterior right hepatic lobe there is a small
12 mm low-density area with simple fluid density compatible with
benign hepatic cysts. Normal liver parenchyma otherwise. Negative
gallbladder. No bile duct enlargement.

Pancreas: Negative.

Spleen: Negative.

Adrenals/Urinary Tract: Normal adrenal glands. Symmetric and normal
renal enhancement. No nephrolithiasis is evident. Proximal ureters
appear within normal limits. Diminutive and unremarkable urinary
bladder.

Stomach/Bowel: Decompressed and negative descending and sigmoid
colon. Unremarkable rectum. Oral contrast has reached the splenic
flexure, which is mildly redundant. Contrast in mild retained stool
throughout the more proximal colon. Normal appendix visible on
series 2, image 42. Negative terminal ileum.

Contrast fills small bowel. No dilated or abnormal small bowel
loops. Unremarkable stomach and duodenum. Free air, free fluid.

Vascular/Lymphatic: Major arterial structures are patent and appear
normal. Portal venous system is patent.

No lymphadenopathy in the abdomen or pelvis. Small bilateral
inguinal lymph nodes are normal, measuring 8 mm short axis or
smaller.

No superficial soft tissue lesion is identified.

Reproductive: Negative.

Other: No pelvic free fluid.

Musculoskeletal: Slight levoconvex lumbar scoliosis. Mild
retrolisthesis at L5-S1. No acute osseous abnormality identified.
IMPRESSION: 1. Negative for lymphadenopathy and essentially normal CT Abdomen
and Pelvis.
2. Small benign cyst in the right lobe of the liver.
